# Patient Record
Sex: Female | Born: 1955 | Race: White | Hispanic: No | Marital: Married | Smoking: Former smoker
Health system: Southern US, Community
[De-identification: ages and names within clinical notes are randomized; demographics above are authoritative.]

## PROBLEM LIST (undated history)

## (undated) DIAGNOSIS — M1990 Unspecified osteoarthritis, unspecified site: Secondary | ICD-10-CM

## (undated) DIAGNOSIS — G4730 Sleep apnea, unspecified: Secondary | ICD-10-CM

## (undated) DIAGNOSIS — G709 Myoneural disorder, unspecified: Secondary | ICD-10-CM

## (undated) DIAGNOSIS — G8929 Other chronic pain: Secondary | ICD-10-CM

## (undated) DIAGNOSIS — Z8719 Personal history of other diseases of the digestive system: Secondary | ICD-10-CM

## (undated) DIAGNOSIS — J189 Pneumonia, unspecified organism: Secondary | ICD-10-CM

## (undated) DIAGNOSIS — G4700 Insomnia, unspecified: Secondary | ICD-10-CM

## (undated) DIAGNOSIS — J45909 Unspecified asthma, uncomplicated: Secondary | ICD-10-CM

## (undated) DIAGNOSIS — M810 Age-related osteoporosis without current pathological fracture: Secondary | ICD-10-CM

## (undated) DIAGNOSIS — M549 Dorsalgia, unspecified: Secondary | ICD-10-CM

## (undated) DIAGNOSIS — E039 Hypothyroidism, unspecified: Secondary | ICD-10-CM

## (undated) HISTORY — PX: TUBAL LIGATION: SHX77

## (undated) HISTORY — DX: Unspecified asthma, uncomplicated: J45.909

## (undated) HISTORY — DX: Sleep apnea, unspecified: G47.30

## (undated) HISTORY — PX: CARPAL TUNNEL RELEASE: SHX101

## (undated) HISTORY — PX: TONSILLECTOMY: SUR1361

---

## 2015-05-05 ENCOUNTER — Other Ambulatory Visit: Payer: Self-pay | Admitting: Surgery

## 2015-05-18 ENCOUNTER — Encounter: Payer: BLUE CROSS/BLUE SHIELD | Attending: Surgery | Admitting: Dietician

## 2015-05-18 DIAGNOSIS — Z6841 Body Mass Index (BMI) 40.0 and over, adult: Secondary | ICD-10-CM | POA: Insufficient documentation

## 2015-05-18 DIAGNOSIS — Z713 Dietary counseling and surveillance: Secondary | ICD-10-CM | POA: Diagnosis not present

## 2015-05-18 NOTE — Progress Notes (Signed)
  Pre-Op Assessment Visit:  Pre-Operative RYGB Surgery  Medical Nutrition Therapy:  Appt start time: 1115   End time:  1145.  Patient was seen on 05/18/15 for Pre-Operative Nutrition Assessment. Assessment and [facility] approval [facility] coordinator on 05/19/15.   Preferred Learning Style:   No preference indicated   Learning Readiness:   Ready  Handouts given during visit include:  Pre-Op Goals Bariatric Surgery Protein Shakes   During the appointment today the following Pre-Op Goals were reviewed with the patient: Maintain or lose weight as instructed by your surgeon Make healthy food choices Begin to limit portion sizes Limited concentrated sugars and fried foods Keep fat/sugar in the single digits per serving on   food labels Practice CHEWING your food  (aim for 30 chews per bite or until applesauce consistency) Practice not drinking 15 minutes before, during, and 30 minutes after each meal/snack Avoid all carbonated beverages  Avoid/limit caffeinated beverages  Avoid all sugar-sweetened beverages Consume 3 meals per day; eat every 3-5 hours Make a list of non-food related activities Aim for 64-100 ounces of FLUID daily  Aim for at least 60-80 grams of PROTEIN daily Look for a liquid protein source that contain ?15 g protein and ?5 g carbohydrate  (ex: shakes, drinks, shots)  Patient-Centered Goals: -Participate in activities with grandkids -Feeling more comfortable in chairs and rides at amusement parks -Self confidence -Reduced back and knee pain -Being able to tie shoes comfortably  Demonstrated degree of understanding via:  Teach Back  Teaching Method Utilized:  Visual Auditory Hands on  Barriers to learning/adherence to lifestyle change: none  [facility] to call the [facility] to enroll in Pre-Op and Post-Op Nutrition Education when surgery date is scheduled.

## 2015-05-19 ENCOUNTER — Encounter: Payer: Self-pay | Admitting: Dietician

## 2015-05-31 ENCOUNTER — Ambulatory Visit: Payer: Self-pay | Admitting: Dietician

## 2015-06-10 ENCOUNTER — Ambulatory Visit (HOSPITAL_COMMUNITY)
Admission: RE | Admit: 2015-06-10 | Discharge: 2015-06-10 | Disposition: A | Discharge status: Home / Self Care | Payer: BLUE CROSS/BLUE SHIELD | Source: Ambulatory Visit | Attending: Surgery | Admitting: Surgery

## 2015-06-10 ENCOUNTER — Other Ambulatory Visit: Payer: Self-pay

## 2015-06-10 DIAGNOSIS — K449 Diaphragmatic hernia without obstruction or gangrene: Secondary | ICD-10-CM | POA: Diagnosis not present

## 2015-06-10 DIAGNOSIS — K8020 Calculus of gallbladder without cholecystitis without obstruction: Secondary | ICD-10-CM | POA: Diagnosis not present

## 2015-06-10 DIAGNOSIS — K760 Fatty (change of) liver, not elsewhere classified: Secondary | ICD-10-CM | POA: Diagnosis not present

## 2015-06-14 ENCOUNTER — Encounter: Payer: Self-pay | Admitting: Dietician

## 2015-06-14 ENCOUNTER — Encounter: Payer: BLUE CROSS/BLUE SHIELD | Attending: Surgery | Admitting: Dietician

## 2015-06-14 DIAGNOSIS — Z6841 Body Mass Index (BMI) 40.0 and over, adult: Secondary | ICD-10-CM | POA: Insufficient documentation

## 2015-06-14 DIAGNOSIS — Z713 Dietary counseling and surveillance: Secondary | ICD-10-CM | POA: Diagnosis not present

## 2015-06-14 NOTE — Progress Notes (Signed)
Supervised Weight Loss:  Appt start time: 405 end time:  420  SWL visit 1:  Primary concerns today: Janet [name] returns today having lost 2 pounds in the last month despite being on prednisone for a sinus infection. She has started taking recommended vitamins. Has started having [place] bacon instead of regular bacon. Also trying to drink more water.   Weight: 217.9 lbs BMI: 41.3  Goals:  -Keep reading Weight Loss Surgery for Dummies -Start thinking about not drinking while eating   Patient-Centered Goals: -Participate in activities with grandkids -Feeling more comfortable in chairs and rides at amusement parks -Self confidence -Reduced back and knee pain -Being able to tie shoes comfortably  MEDICATIONS: see list  DIETARY INTAKE:  24-hr recall: Wakes up around 6am B ( AM): 2 boiled eggs with mayo OR 2 scrambled eggs and tomato or [place] bacon  Snk ( AM) :  L ( PM): leftovers or smoothie (almond milk, frozen fruit) or tuna salad  Snk ( PM):  D ( PM): ham sandwich with chips or stewed beef with green beans and tomatoes  Snk ( PM): fat free, sugar free Jello pudding  Beverages: coffee with agave, water   Recent physical activity: none  Estimated energy needs: 1400-1600 calories  Progress Towards Goal(s):  In progress.   Nutritional Diagnosis:  [place]-3.3 Overweight/obesity related to past poor dietary habits and physical inactivity as evidenced by patient in SWL for pending bariatric surgery following dietary guidelines for continued weight loss.  Intervention:  Nutrition counseling provided.   Monitoring/Evaluation:  Dietary intake, exercise, and body weight in 4 week(s).

## 2015-06-28 ENCOUNTER — Other Ambulatory Visit (HOSPITAL_COMMUNITY): Payer: BLUE CROSS/BLUE SHIELD

## 2015-06-28 ENCOUNTER — Ambulatory Visit (HOSPITAL_COMMUNITY): Payer: BLUE CROSS/BLUE SHIELD

## 2015-06-29 ENCOUNTER — Encounter (HOSPITAL_COMMUNITY)
Admission: RE | Disposition: A | Discharge status: Home / Self Care | Payer: Self-pay | Source: Ambulatory Visit | Attending: Surgery

## 2015-06-29 ENCOUNTER — Ambulatory Visit (HOSPITAL_COMMUNITY)
Admission: RE | Admit: 2015-06-29 | Discharge: 2015-06-29 | Disposition: A | Discharge status: Home / Self Care | Payer: BLUE CROSS/BLUE SHIELD | Source: Ambulatory Visit | Attending: Surgery | Admitting: Surgery

## 2015-06-29 HISTORY — PX: BREATH TEK H PYLORI: SHX5422

## 2015-06-29 SURGERY — BREATH TEST, FOR HELICOBACTER PYLORI

## 2015-06-30 ENCOUNTER — Encounter (HOSPITAL_COMMUNITY): Payer: Self-pay | Admitting: Surgery

## 2015-06-30 DIAGNOSIS — K8020 Calculus of gallbladder without cholecystitis without obstruction: Secondary | ICD-10-CM | POA: Diagnosis not present

## 2015-06-30 DIAGNOSIS — K449 Diaphragmatic hernia without obstruction or gangrene: Secondary | ICD-10-CM | POA: Diagnosis present

## 2015-06-30 DIAGNOSIS — K760 Fatty (change of) liver, not elsewhere classified: Secondary | ICD-10-CM | POA: Diagnosis not present

## 2015-06-30 NOTE — Progress Notes (Signed)
   06/30/15 0709  BREATH TEK ASSESSMENT  Referring MD Ezzard StandingNewman  Time of Last PO Intake 2000  Baseline Breath At: 0714  Pranactin Given At: 0715  Post-Dose Breath At: 0730  Sample 1 2.0  Sample 2 2.1  Test Negative

## 2015-07-05 ENCOUNTER — Encounter: Payer: Self-pay | Admitting: Dietician

## 2015-07-05 ENCOUNTER — Encounter: Payer: BLUE CROSS/BLUE SHIELD | Attending: Surgery | Admitting: Dietician

## 2015-07-05 DIAGNOSIS — Z6841 Body Mass Index (BMI) 40.0 and over, adult: Secondary | ICD-10-CM | POA: Insufficient documentation

## 2015-07-05 DIAGNOSIS — Z713 Dietary counseling and surveillance: Secondary | ICD-10-CM | POA: Insufficient documentation

## 2015-07-05 NOTE — Progress Notes (Signed)
Supervised Weight Loss:  Appt start time: 1200 end time:  1215  SWL visit 2:  Primary concerns today: Eber JonesCarolyn returns today having lost 3 pounds in the last month. Has been sick lately. Has been keeping track of fluid, sugar, and protein intake. Has been practicing chewing well. Has been working on not drinking during meals and has had trouble with that since she has a dry mouth. Reading Weight Loss for dummies.   Has been drinking Boost for a protein shake. Still using some agave in drinks. Taking Centrum Silver chewable.    Weight: 217.9 lbs BMI: 41.3  Goals:  -Keep reading Weight Loss Surgery for Dummies -Continue working not drinking while eating -Energy managerTry Premier Protein Shakes (less sugar and more protein) -Switch to Splenda instead of agave -Buy regular Centrum multivitamin instead of Silver   Patient-Centered Goals: -Participate in activities with grandkids -Feeling more comfortable in chairs and rides at amusement parks -Self confidence -Reduced back and knee pain -Being able to tie shoes comfortably  MEDICATIONS: see list  DIETARY INTAKE:  24-hr recall: Wakes up around 6am B ( AM): 2 boiled eggs with mayo OR 2 scrambled eggs and tomato or [place] bacon  Snk ( AM) :  L ( PM): leftovers or smoothie (almond milk, frozen fruit) or tuna salad  Snk ( PM):  D ( PM): ham sandwich with chips or stewed beef with green beans and tomatoes  Snk ( PM): fat free, sugar free Jello pudding  Beverages: coffee with agave, water   Recent physical activity: working in yard most days, walking in the pool about 1 x week   Estimated energy needs: 1400-1600 calories  Progress Towards Goal(s):  In progress.   Nutritional Diagnosis:  [place]-3.3 Overweight/obesity related to past poor dietary habits and physical inactivity as evidenced by patient in SWL for pending bariatric surgery following dietary guidelines for continued weight loss.  Intervention:  Nutrition counseling  provided.   Monitoring/Evaluation:  Dietary intake, exercise, and body weight in 4 week(s).

## 2015-07-05 NOTE — Patient Instructions (Signed)
Goals:  -Keep reading Weight Loss Surgery for Dummies -Continue working not drinking while eating -Try Premier Protein Shakes (less sugar and more protein) -Switch to Splenda instead of agave -Buy regular Centrum multivitamin instead of Silver

## 2015-08-04 ENCOUNTER — Encounter: Payer: Self-pay | Admitting: Dietician

## 2015-08-04 ENCOUNTER — Encounter: Payer: BLUE CROSS/BLUE SHIELD | Attending: Surgery | Admitting: Dietician

## 2015-08-04 VITALS — Ht 61.0 in | Wt 215.6 lb

## 2015-08-04 DIAGNOSIS — Z6841 Body Mass Index (BMI) 40.0 and over, adult: Secondary | ICD-10-CM | POA: Insufficient documentation

## 2015-08-04 DIAGNOSIS — Z713 Dietary counseling and surveillance: Secondary | ICD-10-CM | POA: Diagnosis not present

## 2015-08-04 DIAGNOSIS — E669 Obesity, unspecified: Secondary | ICD-10-CM

## 2015-08-04 NOTE — Patient Instructions (Addendum)
Goals:  -Keep reading Weight Loss Surgery for Dummies -Continue working not drinking while eating -Buy regular Centrum multivitamin or Flintstone's instead of Silver -Try having more vegetables at meals

## 2015-08-04 NOTE — Progress Notes (Signed)
Supervised Weight Loss:  Appt start time: 225 end time:  240  SWL visit 3:  Primary concerns today: Janet [name] returns today having gained 1 pound in the last month. May need to start thyroid medication. Will be retested next month. Blood sugar was up at that last appointment.   Has been keeping track of fluid, sugar, and protein intake. Has been working on getting sugar intake down. Has been doing more housework and walking more than she was. Still reading Weight Loss for dummies. Drinking about 100+ oz water per day with flavor. Chews well. Has been working on not drinking during meals and has had trouble with that since she has a dry mouth.   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   Has issues with constipation and taking a fiber chew.  Weight: 215.6 lbs BMI: 40.7  Goals:  -Keep reading Weight Loss Surgery for Dummies -Continue working not drinking while eating -Buy regular Centrum multivitamin or Flintstone's instead of Silver -Try having more vegetables at meals   Patient-Centered Goals: -Participate in activities with grandkids -Feeling more comfortable in chairs and rides at amusement parks -Self confidence -Reduced back and knee pain -Being able to tie shoes comfortably  MEDICATIONS: see list  DIETARY INTAKE:  24-hr recall: Wakes up around 6am B ( AM): 2 boiled eggs with mayo OR 2 scrambled eggs and tomato or [place] bacon  Snk ( AM) :  L ( PM): leftovers or smoothie (almond milk, frozen fruit) or tuna salad  Snk ( PM):  D ( PM): ham sandwich with chips or stewed beef with green beans and tomatoes  Snk ( PM): fat free, sugar free Jello pudding  Beverages: coffee with agave, water   Recent physical activity: working in yard most days, walking in the pool about 1 x week   Estimated energy needs: 1400-1600 calories  Progress Towards Goal(s):  In progress.   Nutritional Diagnosis:  [place]-3.3 Overweight/obesity related to past poor dietary habits and  physical inactivity as evidenced by patient in SWL for pending bariatric surgery following dietary guidelines for continued weight loss.  Intervention:  Nutrition counseling provided.   Monitoring/Evaluation:  Dietary intake, exercise, and body weight in 4 week(s).

## 2015-09-07 ENCOUNTER — Encounter: Payer: BLUE CROSS/BLUE SHIELD | Attending: Surgery | Admitting: Dietician

## 2015-09-07 ENCOUNTER — Encounter: Payer: Self-pay | Admitting: Dietician

## 2015-09-07 DIAGNOSIS — Z6841 Body Mass Index (BMI) 40.0 and over, adult: Secondary | ICD-10-CM | POA: Insufficient documentation

## 2015-09-07 DIAGNOSIS — Z713 Dietary counseling and surveillance: Secondary | ICD-10-CM | POA: Diagnosis not present

## 2015-09-07 NOTE — Patient Instructions (Addendum)
Goals:  -Keep reading Weight Loss Surgery for Dummies -Continue working not drinking while eating -Continue your walking routine -Try having more vegetables at meals

## 2015-09-07 NOTE — Progress Notes (Signed)
Supervised Weight Loss:  Appt start time: 330 end time:  345  SWL visit 4:  Primary concerns today: Susa returns today having gained 1 pound in the last month. Feels like she is retaining water. She also states that her thyroid levels were abnormal, was started on synthroid. She has been keeping a food log. She did not bring it today because her husband has had some health problems and she has been distracted. She is struggling not to sip fluids during meals. Practicing portion control and trying to walk more. Taking a complete chewable multivitamin.   Weight: 216.7 lbs BMI: 41  Goals:  -Keep reading Weight Loss Surgery for Dummies -Continue working not drinking while eating -Buy regular Centrum multivitamin or Flintstone's instead of Silver -Try having more vegetables at meals   Patient-Centered Goals: -Participate in activities with grandkids -Feeling more comfortable in chairs and rides at amusement parks -Self confidence -Reduced back and knee pain -Being able to tie shoes comfortably  MEDICATIONS: see list  DIETARY INTAKE:  24-hr recall: Wakes up around 6am B ( AM): 2 boiled eggs with mayo OR 2 scrambled eggs and tomato or [place] bacon  Snk ( AM) :  L ( PM): leftovers or smoothie (almond milk, frozen fruit) or tuna salad  Snk ( PM):  D ( PM): ham sandwich with chips or stewed beef with green beans and tomatoes  Snk ( PM): fat free, sugar free Jello pudding  Beverages: coffee with agave, water   Recent physical activity: working in yard most days, walking in the pool about 1 x week   Estimated energy needs: 1400-1600 calories  Progress Towards Goal(s):  In progress.   Nutritional Diagnosis:  [place]-3.3 Overweight/obesity related to past poor dietary habits and physical inactivity as evidenced by patient in SWL for pending bariatric surgery following dietary guidelines for continued weight loss.  Intervention:  Nutrition counseling provided.   Monitoring/Evaluation:   Dietary intake, exercise, and body weight in 4 week(s).

## 2015-10-04 ENCOUNTER — Ambulatory Visit: Payer: BLUE CROSS/BLUE SHIELD | Admitting: Dietician

## 2015-10-04 ENCOUNTER — Encounter: Payer: Self-pay | Admitting: Dietician

## 2015-10-04 ENCOUNTER — Encounter: Payer: BLUE CROSS/BLUE SHIELD | Attending: Surgery | Admitting: Dietician

## 2015-10-04 DIAGNOSIS — Z6841 Body Mass Index (BMI) 40.0 and over, adult: Secondary | ICD-10-CM | POA: Insufficient documentation

## 2015-10-04 DIAGNOSIS — Z713 Dietary counseling and surveillance: Secondary | ICD-10-CM | POA: Diagnosis not present

## 2015-10-04 NOTE — Progress Notes (Signed)
Supervised Weight Loss:  Appt start time: 305 end time:  320  SWL visit 5:  Primary concerns today: Kendahl returns today having lost 2.5 pounds in the last month. She states that her generic levothyroxine was causing severe diarrhea and was recently switched to synthroid. She is still struggling not to drink while she is eating. Very thirsty in the mornings. Almost finished with Weight Loss Surgery for Dummies book.    Weight: 214 lbs BMI: 40.5  Goals:  -Keep reading Weight Loss Surgery for Dummies -Continue working not drinking while eating -Buy regular Centrum multivitamin or Flintstone's instead of Silver -Try having more vegetables at meals   Patient-Centered Goals: -Participate in activities with grandkids -Feeling more comfortable in chairs and rides at amusement parks -Self confidence -Reduced back and knee pain -Being able to tie shoes comfortably  MEDICATIONS: see list  DIETARY INTAKE:  24-hr recall: Wakes up around 6am B ( AM): 2 boiled eggs with mayo OR 2 scrambled eggs and tomato or [place] bacon OR 1 packet instant oatmeal Snk ( AM) :  L ( PM): leftovers or smoothie (almond milk, frozen fruit) or tuna salad  Snk ( PM):  D ( PM): ham sandwich with chips or stewed beef with green beans and tomatoes  Snk ( PM): fat free, sugar free Jello pudding  Beverages: coffee with agave, water   Recent physical activity: working in yard most days, walking in the pool about 1 x week   Estimated energy needs: 1400-1600 calories  Progress Towards Goal(s):  In progress.   Nutritional Diagnosis:  -3.3 Overweight/obesity related to past poor dietary habits and physical inactivity as evidenced by patient in SWL for pending bariatric surgery following dietary guidelines for continued weight loss.  Intervention:  Nutrition counseling provided.   Monitoring/Evaluation:  Dietary intake, exercise, and body weight in 4 week(s).

## 2015-11-17 ENCOUNTER — Encounter: Payer: BLUE CROSS/BLUE SHIELD | Attending: Surgery | Admitting: Dietician

## 2015-11-17 DIAGNOSIS — Z713 Dietary counseling and surveillance: Secondary | ICD-10-CM | POA: Diagnosis not present

## 2015-11-17 DIAGNOSIS — Z6841 Body Mass Index (BMI) 40.0 and over, adult: Secondary | ICD-10-CM | POA: Insufficient documentation

## 2015-11-17 NOTE — Progress Notes (Signed)
Supervised Weight Loss:  Appt start time: 405 end time:  420  SWL visit 6:  Primary concerns today: Janet JonesCarolyn returns today having gained 2 pounds. Finished with Weight Loss Surgery for Dummies book. She plans to start water aerobics in January. She is a [name] nervous but excited and feeling postive about surgery. She is getting more used to not drinking while eating. She feels mentally prepared for the types of foods that are recommended as well as the amount.    Weight: 216 lbs BMI: 40.9  Goals:  -Keep reading Weight Loss Surgery for Dummies -Continue working not drinking while eating -Buy regular Centrum multivitamin or Flintstone's instead of Silver -Try having more vegetables at meals   Patient-Centered Goals: -Participate in activities with grandkids -Feeling more comfortable in chairs and rides at amusement parks -Self confidence -Reduced back and knee pain -Being able to tie shoes comfortably  MEDICATIONS: see list  DIETARY INTAKE:  24-hr recall: Wakes up around 6am B ( AM): 2 boiled eggs with mayo OR 2 scrambled eggs and tomato or [place] bacon OR 1 packet instant oatmeal Snk ( AM) :  L ( PM): leftovers or smoothie (almond milk, frozen fruit) or tuna salad  Snk ( PM):  D ( PM): ham sandwich with chips or stewed beef with green beans and tomatoes  Snk ( PM): fat free, sugar free Jello pudding  Beverages: coffee with agave, water   Recent physical activity: working in yard most days, walking in the pool about 1 x week   Estimated energy needs: 1400-1600 calories  Progress Towards Goal(s):  In progress.   Nutritional Diagnosis:  Janet [name]-3.3 Overweight/obesity related to past poor dietary habits and physical inactivity as evidenced by patient in SWL for pending bariatric surgery following dietary guidelines for continued weight loss.  Intervention:  Nutrition counseling provided.   Monitoring/Evaluation:  Dietary intake, exercise, and body weight in 4 week(s).

## 2015-11-18 ENCOUNTER — Encounter: Payer: Self-pay | Admitting: Dietician

## 2016-01-04 ENCOUNTER — Other Ambulatory Visit: Payer: Self-pay | Admitting: Surgery

## 2016-01-09 ENCOUNTER — Ambulatory Visit: Payer: BLUE CROSS/BLUE SHIELD

## 2016-01-10 ENCOUNTER — Encounter: Payer: Self-pay | Admitting: Dietician

## 2016-01-10 ENCOUNTER — Encounter: Payer: BLUE CROSS/BLUE SHIELD | Attending: Surgery | Admitting: Dietician

## 2016-01-10 DIAGNOSIS — Z6841 Body Mass Index (BMI) 40.0 and over, adult: Secondary | ICD-10-CM | POA: Diagnosis not present

## 2016-01-10 DIAGNOSIS — Z713 Dietary counseling and surveillance: Secondary | ICD-10-CM | POA: Diagnosis not present

## 2016-01-10 NOTE — Progress Notes (Signed)
  Pre-Operative Nutrition Class:  Appt start time: 900   End time:  1000.  Patient was seen on 01/10/2016 for Pre-Operative Bariatric Surgery Education at [facility].   Surgery date: 01/23/2016 Surgery type: RYGB [facility] weight at [facility]: 220 lbs on 05/18/2015 Weight today: 223 lbs  TANITA  BODY COMP RESULTS  01/10/16   BMI (kg/m^2) 42.1   Fat Mass (lbs) 114.5   Fat Free Mass (lbs) 108.5   Total Body Water (lbs) 79.5   Samples given per MNT protocol. Patient educated on appropriate usage: Unjury protein powder (vanilla - qty 1) Lot #: [id number] Exp: 08/2016  Premier protein shake (strawberry - qty 1) Lot #: [id number] Exp: 08/2016  Celebrate calcium citrate chew (berry - qty 1) Lot #: [id number] Exp: 02/2017  PB2 (qty 1) Lot #: [id number] Exp: 09/2017  The following the learning objectives were met by the patient during this course:  Identify Pre-Op Dietary Goals and will begin 2 weeks pre-operatively  Identify appropriate sources of fluids and proteins   State protein recommendations and appropriate sources pre and post-operatively  Identify Post-Operative Dietary Goals and will follow for 2 weeks post-operatively  Identify appropriate multivitamin and calcium sources  Describe the need for physical activity post-operatively and will follow MD recommendations  State when to call healthcare provider regarding medication questions or post-operative complications  Handouts given during class include:  Pre-Op Bariatric Surgery Diet Handout  Protein Shake Handout  Post-Op Bariatric Surgery Nutrition Handout  BELT Program Information Flyer  Support Group Information Flyer  [facility] Bariatric Supplements Price List  Follow-Up Plan: Patient will follow-up at [facility] 2 weeks post operatively for diet advancement per MD.

## 2016-01-18 ENCOUNTER — Encounter (HOSPITAL_COMMUNITY)
Admission: RE | Admit: 2016-01-18 | Discharge: 2016-01-18 | Disposition: A | Discharge status: Home / Self Care | Payer: BLUE CROSS/BLUE SHIELD | Source: Ambulatory Visit | Attending: Surgery | Admitting: Surgery

## 2016-01-18 ENCOUNTER — Encounter (HOSPITAL_COMMUNITY): Payer: Self-pay

## 2016-01-18 DIAGNOSIS — Z01812 Encounter for preprocedural laboratory examination: Secondary | ICD-10-CM | POA: Diagnosis not present

## 2016-01-18 DIAGNOSIS — K8020 Calculus of gallbladder without cholecystitis without obstruction: Secondary | ICD-10-CM | POA: Insufficient documentation

## 2016-01-18 HISTORY — DX: Age-related osteoporosis without current pathological fracture: M81.0

## 2016-01-18 HISTORY — DX: Dorsalgia, unspecified: M54.9

## 2016-01-18 HISTORY — DX: Insomnia, unspecified: G47.00

## 2016-01-18 HISTORY — DX: Unspecified osteoarthritis, unspecified site: M19.90

## 2016-01-18 HISTORY — DX: Other chronic pain: G89.29

## 2016-01-18 HISTORY — DX: Hypothyroidism, unspecified: E03.9

## 2016-01-18 LAB — COMPREHENSIVE METABOLIC PANEL
ALBUMIN: 4 g/dL (ref 3.5–5.0)
ALK PHOS: 45 U/L (ref 38–126)
ALT: 22 U/L (ref 14–54)
ANION GAP: 10 (ref 5–15)
AST: 24 U/L (ref 15–41)
BILIRUBIN TOTAL: 0.3 mg/dL (ref 0.3–1.2)
BUN: 16 mg/dL (ref 6–20)
CALCIUM: 9.9 mg/dL (ref 8.9–10.3)
CO2: 27 mmol/L (ref 22–32)
Chloride: 103 mmol/L (ref 101–111)
Creatinine, Ser: 0.76 mg/dL (ref 0.44–1.00)
GFR calc Af Amer: >60 mL/min (ref >60)
GFR calc non Af Amer: >60 mL/min (ref >60)
GLUCOSE: 88 mg/dL (ref 65–99)
POTASSIUM: 4.8 mmol/L (ref 3.5–5.1)
SODIUM: 140 mmol/L (ref 135–145)
TOTAL PROTEIN: 7.7 g/dL (ref 6.5–8.1)

## 2016-01-18 LAB — CBC WITH DIFFERENTIAL/PLATELET
BASOS ABS: 0 10*3/uL (ref 0.0–0.1)
BASOS PCT: 0 %
EOS ABS: 0.1 10*3/uL (ref 0.0–0.7)
Eosinophils Relative: 1 %
HEMATOCRIT: 39.2 % (ref 36.0–46.0)
HEMOGLOBIN: 12.6 g/dL (ref 12.0–15.0)
Lymphocytes Relative: 19 %
Lymphs Abs: 1.8 10*3/uL (ref 0.7–4.0)
MCH: 30.4 pg (ref 26.0–34.0)
MCHC: 32.1 g/dL (ref 30.0–36.0)
MCV: 94.7 fL (ref 78.0–100.0)
Monocytes Absolute: 0.6 10*3/uL (ref 0.1–1.0)
Monocytes Relative: 7 %
NEUTROS ABS: 7.1 10*3/uL (ref 1.7–7.7)
NEUTROS PCT: 73 %
Platelets: 283 10*3/uL (ref 150–400)
RBC: 4.14 MIL/uL (ref 3.87–5.11)
RDW: 15 % (ref 11.5–15.5)
WBC: 9.6 10*3/uL (ref 4.0–10.5)

## 2016-01-18 NOTE — Patient Instructions (Addendum)
YOUR PROCEDURE IS SCHEDULED ON :  01/23/16  REPORT TO [facility] MAIN ENTRANCE FOLLOW SIGNS TO EAST ELEVATOR - GO TO 3rd FLOOR CHECK IN AT 3 EAST NURSES STATION (SHORT STAY) AT:  5:05 AM  CALL THIS NUMBER IF YOU HAVE PROBLEMS THE MORNING OF SURGERY [phone]  REMEMBER:ONLY 1 PER PERSON MAY GO TO SHORT STAY WITH YOU TO GET READY THE MORNING OF YOUR SURGERY  DO NOT EAT FOOD OR DRINK LIQUIDS AFTER MIDNIGHT  TAKE THESE MEDICINES THE MORNING OF SURGERY: CYMBALTA / SYNTHROID / USE NASONEX SPRAY  YOU MAY NOT HAVE ANY METAL ON YOUR BODY INCLUDING HAIR PINS AND PIERCING'S. DO NOT WEAR JEWELRY, MAKEUP, LOTIONS, POWDERS OR PERFUMES. DO NOT WEAR NAIL POLISH. DO NOT SHAVE 48 HRS PRIOR TO SURGERY. MEN MAY SHAVE FACE AND NECK.  DO NOT BRING VALUABLES TO HOSPITAL. [facility] IS NOT RESPONSIBLE FOR VALUABLES.  CONTACTS, DENTURES OR PARTIALS MAY NOT BE WORN TO SURGERY. LEAVE SUITCASE IN CAR. CAN BE BROUGHT TO ROOM AFTER SURGERY.  PATIENTS DISCHARGED THE DAY OF SURGERY WILL NOT BE ALLOWED TO DRIVE HOME.  PLEASE READ OVER THE FOLLOWING INSTRUCTION SHEETS _________________________________________________________________________________                                          [place] - PREPARING FOR SURGERY  Before surgery, you can play an important role.  Because skin is not sterile, your skin needs to be as free of germs as possible.  You can reduce the number of germs on your skin by washing with CHG (chlorahexidine gluconate) soap before surgery.  CHG is an antiseptic cleaner which kills germs and bonds with the skin to continue killing germs even after washing. Please DO NOT use if you have an allergy to CHG or antibacterial soaps.  If your skin becomes reddened/irritated stop using the CHG and inform your nurse when you arrive at Short Stay. Do not shave (including legs and underarms) for at least 48 hours prior to the first CHG shower.  You may shave your face. Please  follow these instructions carefully:   1.  Shower with CHG Soap the night before surgery and the  morning of Surgery.   2.  If you choose to wash your hair, wash your hair first as usual with your  normal  Shampoo.   3.  After you shampoo, rinse your hair and body thoroughly to remove the  shampoo.                                         4.  Use CHG as you would any other liquid soap.  You can apply chg directly  to the skin and wash . Gently wash with scrungie or clean wascloth    5.  Apply the CHG Soap to your body ONLY FROM THE NECK DOWN.   Do not use on open                           Wound or open sores. Avoid contact with eyes, ears mouth and genitals (private parts).                        Genitals (private parts)  with your normal soap.              6.  Wash thoroughly, paying special attention to the area where your surgery  will be performed.   7.  Thoroughly rinse your body with warm water from the neck down.   8.  DO NOT shower/wash with your normal soap after using and rinsing off  the CHG Soap .                9.  Pat yourself dry with a clean towel.             10.  Wear clean night clothes to bed after shower             11.  Place clean sheets on your bed the night of your first shower and do not  sleep with pets.  Day of Surgery : Do not apply any lotions/deodorants the morning of surgery.  Please wear clean clothes to the hospital/surgery center.  FAILURE TO FOLLOW THESE INSTRUCTIONS MAY RESULT IN THE CANCELLATION OF YOUR SURGERY    PATIENT SIGNATURE_________________________________  ______________________________________________________________________

## 2016-01-22 NOTE — H&P (Signed)
Janet [name]  Location: [facility] Patient #: [id number] DOB: August 22, 1956 Married / Language: English / Race: White Female   History of Present Illness   The patient is a 60 year old female who presents for follow up bariatric surgery evaluation.  She goes by "Janet [name]".   Her PCP is Dr. Salvadore Dom [facility])  She comes with her husband.   She is doing well. She is ready for the surgery. She is scheduled for 23 January 2016 for her gastric bypass. I went over with her the findings on the gallbladder ultrasound and discussed with her about proceeding with cholecystectomy at the same time as a gastric bypass.   I gave her information about her bowel prep, I gave her prescription for postop pain medicine.  She is ready for surgery.  She had an UGI on 06/10/2015 - small HH She had an [place] on 06/10/2015 - gall stones Psych - Dr. Cyndia Skeeters - 05/10/2015  History of obesity (05/2015): She went to the information session online. Her husband's cousin had a sleeve gastrectomy. Her husband works with somebody had a gastric bypass. The patient is interested in a gastric bypass.  She has tried multiple diets including Weight Watchers, Nutrisystem, Slim fast, [employer] at home. She had the most success with the [facility] in [place], but that has closed down (the owner died). She had the most success with the [facility] Center - she lost 90 pounds, but gained it all back.  Per the 1991 NIH Consensus Statement, the patient is a candidate for bariatric surgery. The patient attended an information session and reviewed the types of bariatric surgery.  The patient is interested in the Roux en Y Gastric Bypass. I discussed with the patient the indications and risks of bariatric surgery. The potential risks of surgery include, but are not limited to, bleeding, infection, leak from the bowel, DVT and PE, open surgery, long term nutrition consequences,  and death. The patient understands the importance of compliance and long term follow-up with our group after surgery.  I discussed with the patient the indications and risks of gall bladder surgery. The primary risks of gall bladder surgery include, but are not limited to, bleeding, infection, common bile duct injury, and open surgery. I tried to answer the patient's questions. I gave the patient literature about gall bladder surgery.  Past Medical History: 1. Sleep apnea - but cannot tolerate CPAP 2. Back pain, arthiritis, and issues. she has had 4 injections She sees Dr. Venetia Maxon 3. Family history of colon cancer - she gets a colonoscopy every 5 years 4. Tubal ligation  Social history: Married. Her husband is with her. 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 Her mother died of ALS. She has 1 son age 81. Note: She does not like to drive in [place].   Other Problems Kandis Cocking, MD; 01/12/2016 4:24 PM) Arthritis Asthma Back Pain Sleep Apnea  Past Surgical History Kandis Cocking, MD; 01/12/2016 4:24 PM) Breast Biopsy Right. Tonsillectomy  Allergies (Sonya Bynum, CMA; 01/12/2016 4:19 PM) Codeine and Related  Medication History (Sonya Bynum, CMA; 01/12/2016 4:19 PM) Nasonex (50MCG/ACT Suspension, Nasal) Active. DULoxetine HCl (  Capsule DR Part, Oral) Active. TraZODone HCl (  Tablet, Oral) Active. Carisoprodol (  Tablet, Oral) Active. Nucynta ER (  Tablet ER 12HR, Oral) Active. Mucinex (  Tablet ER, Oral) Active. Loratadine (  Tablet Chewable, Oral) Active. Medications Reconciled Synthroid ( Tablet, Oral) Active.  Vitals (Sonya Bynum  CMA; 01/12/2016 4:18 PM) 01/12/2016 4:18 PM Weight: 221 lb Height: 61in Body Surface Area: 1.97 m Body Mass Index: 41.76 kg/m  Temp.: 97.76F(Temporal)  Pulse: 81 (Regular)  BP: 130/72 (Sitting, Left Arm,  Standard)   Physical Exam  General: WN obese WF alert and generally healthy appearing. HEENT: Normal. Pupils equal.  Neck: Supple. No mass. No thyroid mass. Lymph Nodes: No supraclavicular or cervical nodes.  Lungs: Clear to auscultation and symmetric breath sounds. Heart: RRR. No murmur or rub.  Abdomen: Soft. No mass. No tenderness. No hernia. Normal bowel sounds. umbilical scar She is about 1/2 apple and about 1/2 pear  Extremities: Good strength and ROM in upper and lower extremities.  Neurologic: Grossly intact to motor and sensory function. Psychiatric: Has normal mood and affect. Behavior is normal.  Assessment & Plan  1.  OBESITY, MORBID, BMI 40.0-49.9  Impression: She is scheduled for RYGB on 01/23/2016.   She has her bowel prep  She has her post op pain meds.  2.  ASYMPTOMATIC CHOLELITHIASIS (K80.20)  Impression: Discussed lap chole with patient and I gave her literature on gall bladder surgery  3. Sleep apnea - but cannot tolerate CPAP 4. Back pain, arthiritis, and issues. she has had 4 injections She sees Dr. Rockie Neighbours, MD, [facility] Pager: [phone] Office phone:  [phone]

## 2016-01-23 ENCOUNTER — Inpatient Hospital Stay (HOSPITAL_COMMUNITY): Payer: BLUE CROSS/BLUE SHIELD

## 2016-01-23 ENCOUNTER — Inpatient Hospital Stay (HOSPITAL_COMMUNITY): Payer: BLUE CROSS/BLUE SHIELD | Admitting: Certified Registered"

## 2016-01-23 ENCOUNTER — Inpatient Hospital Stay (HOSPITAL_COMMUNITY)
Admission: RE | Admit: 2016-01-23 | Discharge: 2016-01-25 | DRG: 620 | Disposition: A | Discharge status: Home / Self Care | Payer: BLUE CROSS/BLUE SHIELD | Source: Ambulatory Visit | Attending: Surgery | Admitting: Surgery

## 2016-01-23 ENCOUNTER — Encounter (HOSPITAL_COMMUNITY): Payer: Self-pay

## 2016-01-23 ENCOUNTER — Encounter (HOSPITAL_COMMUNITY)
Admission: RE | Disposition: A | Discharge status: Home / Self Care | Payer: Self-pay | Source: Ambulatory Visit | Attending: Surgery

## 2016-01-23 DIAGNOSIS — K8010 Calculus of gallbladder with chronic cholecystitis without obstruction: Secondary | ICD-10-CM | POA: Diagnosis present

## 2016-01-23 DIAGNOSIS — M1990 Unspecified osteoarthritis, unspecified site: Secondary | ICD-10-CM | POA: Diagnosis present

## 2016-01-23 DIAGNOSIS — E039 Hypothyroidism, unspecified: Secondary | ICD-10-CM | POA: Diagnosis present

## 2016-01-23 DIAGNOSIS — R52 Pain, unspecified: Secondary | ICD-10-CM

## 2016-01-23 DIAGNOSIS — Z6841 Body Mass Index (BMI) 40.0 and over, adult: Secondary | ICD-10-CM

## 2016-01-23 DIAGNOSIS — M549 Dorsalgia, unspecified: Secondary | ICD-10-CM | POA: Diagnosis present

## 2016-01-23 DIAGNOSIS — Z9889 Other specified postprocedural states: Secondary | ICD-10-CM | POA: Diagnosis not present

## 2016-01-23 DIAGNOSIS — Z01812 Encounter for preprocedural laboratory examination: Secondary | ICD-10-CM

## 2016-01-23 DIAGNOSIS — Z9884 Bariatric surgery status: Secondary | ICD-10-CM

## 2016-01-23 DIAGNOSIS — G4730 Sleep apnea, unspecified: Secondary | ICD-10-CM | POA: Diagnosis present

## 2016-01-23 DIAGNOSIS — Z87891 Personal history of nicotine dependence: Secondary | ICD-10-CM

## 2016-01-23 HISTORY — PX: CHOLECYSTECTOMY: SHX55

## 2016-01-23 HISTORY — PX: GASTRIC ROUX-EN-Y: SHX5262

## 2016-01-23 LAB — PREGNANCY, URINE: PREG TEST UR: NEGATIVE

## 2016-01-23 LAB — HEMOGLOBIN AND HEMATOCRIT, BLOOD
HEMATOCRIT: 39.2 % (ref 36.0–46.0)
HEMOGLOBIN: 12.6 g/dL (ref 12.0–15.0)

## 2016-01-23 SURGERY — LAPAROSCOPIC ROUX-EN-Y GASTRIC BYPASS WITH UPPER ENDOSCOPY
Anesthesia: Spinal | Site: Abdomen

## 2016-01-23 MED ORDER — ONDANSETRON HCL 4 MG/2ML IJ SOLN
INTRAMUSCULAR | Status: DC | PRN
  Administered 2016-01-23 (×2): 4 mg via INTRAVENOUS

## 2016-01-23 MED ORDER — SUCCINYLCHOLINE CHLORIDE 20 MG/ML IJ SOLN
INTRAMUSCULAR | Status: DC | PRN
  Administered 2016-01-23: 100 mg via INTRAVENOUS

## 2016-01-23 MED ORDER — ROCURONIUM BROMIDE 100 MG/10ML IV SOLN
INTRAVENOUS | Status: AC
  Filled 2016-01-23: qty 1

## 2016-01-23 MED ORDER — SUGAMMADEX SODIUM 200 MG/2ML IV SOLN
INTRAVENOUS | Status: AC
  Filled 2016-01-23: qty 2

## 2016-01-23 MED ORDER — LIDOCAINE HCL (CARDIAC) 20 MG/ML IV SOLN
INTRAVENOUS | Status: DC | PRN
  Administered 2016-01-23: 80 mg via INTRAVENOUS

## 2016-01-23 MED ORDER — ONDANSETRON HCL 4 MG/2ML IJ SOLN
4.0000 mg | INTRAMUSCULAR | Status: DC | PRN
  Administered 2016-01-23 – 2016-01-24 (×3): 4 mg via INTRAVENOUS
  Filled 2016-01-23 (×3): qty 2

## 2016-01-23 MED ORDER — LACTATED RINGERS IV SOLN
INTRAVENOUS | Status: DC
  Administered 2016-01-23: 12:00:00 via INTRAVENOUS

## 2016-01-23 MED ORDER — HEPARIN SODIUM (PORCINE) 5000 UNIT/ML IJ SOLN
5000.0000 [IU] | Freq: Three times a day (TID) | INTRAMUSCULAR | Status: DC
  Administered 2016-01-23 – 2016-01-25 (×5): 5000 [IU] via SUBCUTANEOUS
  Filled 2016-01-23 (×8): qty 1

## 2016-01-23 MED ORDER — ACETAMINOPHEN 10 MG/ML IV SOLN
INTRAVENOUS | Status: DC | PRN
  Administered 2016-01-23: 1000 mg via INTRAVENOUS

## 2016-01-23 MED ORDER — LACTATED RINGERS IV SOLN
INTRAVENOUS | Status: DC | PRN
  Administered 2016-01-23 (×3): via INTRAVENOUS

## 2016-01-23 MED ORDER — CHLORHEXIDINE GLUCONATE 0.12 % MT SOLN
15.0000 mL | Freq: Two times a day (BID) | OROMUCOSAL | Status: DC
  Administered 2016-01-24 – 2016-01-25 (×3): 15 mL via OROMUCOSAL
  Filled 2016-01-23 (×5): qty 15

## 2016-01-23 MED ORDER — LACTATED RINGERS IR SOLN
Status: DC | PRN
  Administered 2016-01-23: 3000 mL

## 2016-01-23 MED ORDER — CHLORHEXIDINE GLUCONATE 4 % EX LIQD: 60.0000 mL | Freq: Once | CUTANEOUS | Status: DC

## 2016-01-23 MED ORDER — FENTANYL CITRATE (PF) 100 MCG/2ML IJ SOLN
INTRAMUSCULAR | Status: DC | PRN
  Administered 2016-01-23 (×2): 100 ug via INTRAVENOUS
  Administered 2016-01-23 (×4): 50 ug via INTRAVENOUS
  Administered 2016-01-23 (×2): 25 ug via INTRAVENOUS
  Administered 2016-01-23: 50 ug via INTRAVENOUS

## 2016-01-23 MED ORDER — ONDANSETRON HCL 4 MG/2ML IJ SOLN
INTRAMUSCULAR | Status: AC
  Filled 2016-01-23: qty 4

## 2016-01-23 MED ORDER — FENTANYL CITRATE (PF) 100 MCG/2ML IJ SOLN
25.0000 ug | INTRAMUSCULAR | Status: DC | PRN
  Administered 2016-01-23: 50 ug via INTRAVENOUS
  Administered 2016-01-23 (×2): 25 ug via INTRAVENOUS

## 2016-01-23 MED ORDER — CETYLPYRIDINIUM CHLORIDE 0.05 % MT LIQD
7.0000 mL | Freq: Two times a day (BID) | OROMUCOSAL | Status: DC
  Administered 2016-01-23 – 2016-01-24 (×2): 7 mL via OROMUCOSAL

## 2016-01-23 MED ORDER — DEXAMETHASONE SODIUM PHOSPHATE 10 MG/ML IJ SOLN
INTRAMUSCULAR | Status: AC
  Filled 2016-01-23: qty 1

## 2016-01-23 MED ORDER — ACETAMINOPHEN 160 MG/5ML PO SOLN
650.0000 mg | ORAL | Status: DC | PRN
  Administered 2016-01-25: 650 mg via ORAL
  Filled 2016-01-23: qty 20.3

## 2016-01-23 MED ORDER — PROPOFOL 10 MG/ML IV BOLUS
INTRAVENOUS | Status: AC
  Filled 2016-01-23: qty 20

## 2016-01-23 MED ORDER — HEPARIN SODIUM (PORCINE) 5000 UNIT/ML IJ SOLN
5000.0000 [IU] | INTRAMUSCULAR | Status: AC
  Administered 2016-01-23: 5000 [IU] via SUBCUTANEOUS
  Filled 2016-01-23: qty 1

## 2016-01-23 MED ORDER — 0.9 % SODIUM CHLORIDE (POUR BTL) OPTIME
TOPICAL | Status: DC | PRN
  Administered 2016-01-23: 2000 mL

## 2016-01-23 MED ORDER — PROPOFOL 10 MG/ML IV BOLUS
INTRAVENOUS | Status: DC | PRN
  Administered 2016-01-23: 200 mg via INTRAVENOUS

## 2016-01-23 MED ORDER — BUPIVACAINE HCL 0.25 % IJ SOLN
INTRAMUSCULAR | Status: AC
  Filled 2016-01-23: qty 1

## 2016-01-23 MED ORDER — BUPIVACAINE HCL (PF) 0.25 % IJ SOLN
INTRAMUSCULAR | Status: DC | PRN
  Administered 2016-01-23: 48 mL

## 2016-01-23 MED ORDER — SUGAMMADEX SODIUM 200 MG/2ML IV SOLN
INTRAVENOUS | Status: DC | PRN
  Administered 2016-01-23: 200 mg via INTRAVENOUS

## 2016-01-23 MED ORDER — FENTANYL CITRATE (PF) 100 MCG/2ML IJ SOLN
INTRAMUSCULAR | Status: AC
  Filled 2016-01-23: qty 2

## 2016-01-23 MED ORDER — CEFOTETAN DISODIUM-DEXTROSE 2-2.08 GM-% IV SOLR
INTRAVENOUS | Status: AC
  Filled 2016-01-23: qty 50

## 2016-01-23 MED ORDER — SCOPOLAMINE 1 MG/3DAYS TD PT72
MEDICATED_PATCH | TRANSDERMAL | Status: AC
Start: 2016-01-23 — End: 2016-01-23
  Filled 2016-01-23: qty 1

## 2016-01-23 MED ORDER — DEXAMETHASONE SODIUM PHOSPHATE 10 MG/ML IJ SOLN
INTRAMUSCULAR | Status: DC | PRN
  Administered 2016-01-23: 10 mg via INTRAVENOUS

## 2016-01-23 MED ORDER — ESMOLOL HCL 100 MG/10ML IV SOLN
INTRAVENOUS | Status: AC
  Filled 2016-01-23: qty 10

## 2016-01-23 MED ORDER — FENTANYL CITRATE (PF) 250 MCG/5ML IJ SOLN
INTRAMUSCULAR | Status: AC
  Filled 2016-01-23: qty 5

## 2016-01-23 MED ORDER — ROCURONIUM BROMIDE 100 MG/10ML IV SOLN
INTRAVENOUS | Status: DC | PRN
  Administered 2016-01-23: 50 mg via INTRAVENOUS
  Administered 2016-01-23: 20 mg via INTRAVENOUS
  Administered 2016-01-23: 10 mg via INTRAVENOUS
  Administered 2016-01-23: 30 mg via INTRAVENOUS

## 2016-01-23 MED ORDER — ACETAMINOPHEN 10 MG/ML IV SOLN
INTRAVENOUS | Status: AC
  Filled 2016-01-23: qty 100

## 2016-01-23 MED ORDER — PROPOFOL 500 MG/50ML IV EMUL
INTRAVENOUS | Status: DC | PRN
  Administered 2016-01-23: 25 ug/kg/min via INTRAVENOUS

## 2016-01-23 MED ORDER — SODIUM CHLORIDE 0.9 % IJ SOLN
INTRAMUSCULAR | Status: DC | PRN
  Administered 2016-01-23: 8 mL

## 2016-01-23 MED ORDER — ACETAMINOPHEN 160 MG/5ML PO SOLN: 325.0000 mg | ORAL | Status: DC | PRN

## 2016-01-23 MED ORDER — PROMETHAZINE HCL 25 MG/ML IJ SOLN: 6.2500 mg | INTRAMUSCULAR | Status: DC | PRN

## 2016-01-23 MED ORDER — SCOPOLAMINE 1 MG/3DAYS TD PT72
MEDICATED_PATCH | TRANSDERMAL | Status: DC | PRN
  Administered 2016-01-23: 1 via TRANSDERMAL

## 2016-01-23 MED ORDER — LIDOCAINE HCL (CARDIAC) 20 MG/ML IV SOLN
INTRAVENOUS | Status: AC
  Filled 2016-01-23: qty 5

## 2016-01-23 MED ORDER — MIDAZOLAM HCL 2 MG/2ML IJ SOLN
INTRAMUSCULAR | Status: AC
  Filled 2016-01-23: qty 2

## 2016-01-23 MED ORDER — PREMIER PROTEIN SHAKE
2.0000 [oz_av] | Freq: Four times a day (QID) | ORAL | Status: DC
  Administered 2016-01-25: 2 [oz_av] via ORAL

## 2016-01-23 MED ORDER — CEFOTETAN DISODIUM-DEXTROSE 2-2.08 GM-% IV SOLR
2.0000 g | INTRAVENOUS | Status: AC
  Administered 2016-01-23: 2 g via INTRAVENOUS

## 2016-01-23 MED ORDER — OXYCODONE HCL 5 MG/5ML PO SOLN
5.0000 mg | ORAL | Status: DC | PRN
  Administered 2016-01-24 – 2016-01-25 (×2): 5 mg via ORAL
  Filled 2016-01-23 (×2): qty 5

## 2016-01-23 MED ORDER — MIDAZOLAM HCL 5 MG/5ML IJ SOLN
INTRAMUSCULAR | Status: DC | PRN
  Administered 2016-01-23: 2 mg via INTRAVENOUS

## 2016-01-23 MED ORDER — POTASSIUM CHLORIDE IN NACL 20-0.45 MEQ/L-% IV SOLN
INTRAVENOUS | Status: DC
  Administered 2016-01-23: 150 mL/h via INTRAVENOUS
  Administered 2016-01-23 – 2016-01-24 (×2): via INTRAVENOUS
  Administered 2016-01-24: 1000 mL via INTRAVENOUS
  Administered 2016-01-24: 22:00:00 via INTRAVENOUS
  Administered 2016-01-24: 1000 mL via INTRAVENOUS
  Filled 2016-01-23 (×9): qty 1000

## 2016-01-23 MED ORDER — TISSEEL VH 10 ML EX KIT
PACK | CUTANEOUS | Status: DC | PRN
  Administered 2016-01-23: 2

## 2016-01-23 MED ORDER — STERILE WATER FOR IRRIGATION IR SOLN
Status: DC | PRN
  Administered 2016-01-23: 1500 mL

## 2016-01-23 MED ORDER — ESMOLOL HCL 100 MG/10ML IV SOLN
INTRAVENOUS | Status: DC | PRN
  Administered 2016-01-23 (×3): 20 mg via INTRAVENOUS

## 2016-01-23 MED ORDER — MORPHINE SULFATE (PF) 2 MG/ML IV SOLN
2.0000 mg | INTRAVENOUS | Status: DC | PRN
  Administered 2016-01-23 (×2): 2 mg via INTRAVENOUS
  Administered 2016-01-23: 4 mg via INTRAVENOUS
  Administered 2016-01-24: 2 mg via INTRAVENOUS
  Administered 2016-01-24: 4 mg via INTRAVENOUS
  Filled 2016-01-23 (×3): qty 2
  Filled 2016-01-23 (×3): qty 1

## 2016-01-23 MED ORDER — TISSEEL VH 10 ML EX KIT
PACK | CUTANEOUS | Status: AC
  Filled 2016-01-23: qty 2

## 2016-01-23 SURGICAL SUPPLY — 77 items
APPLIER CLIP ROT 10 11.4 M/L (STAPLE)
APPLIER CLIP ROT 13.4 12 LRG (CLIP)
BLADE SURG 15 STRL LF DISP TIS (BLADE) ×1 IMPLANT
BLADE SURG 15 STRL SS (BLADE) ×2
CABLE HIGH FREQUENCY MONO STRZ (ELECTRODE) IMPLANT
CHLORAPREP W/TINT 26ML (MISCELLANEOUS) ×6 IMPLANT
CLIP APPLIE ROT 10 11.4 M/L (STAPLE) IMPLANT
CLIP APPLIE ROT 13.4 12 LRG (CLIP) IMPLANT
CLIP SUT LAPRA TY ABSORB (SUTURE) ×6 IMPLANT
COVER MAYO STAND STRL (DRAPES) ×3 IMPLANT
COVER SURGICAL LIGHT HANDLE (MISCELLANEOUS) IMPLANT
CUTTER FLEX LINEAR 45M (STAPLE) ×3 IMPLANT
DECANTER SPIKE VIAL GLASS SM (MISCELLANEOUS) ×3 IMPLANT
DEVICE SUT QUICK LOAD TK 5 (STAPLE) IMPLANT
DEVICE SUT TI-KNOT TK 5X26 (MISCELLANEOUS) IMPLANT
DEVICE SUTURE ENDOST 10MM (ENDOMECHANICALS) ×3 IMPLANT
DEVICE TI KNOT TK5 (MISCELLANEOUS)
DISSECTOR BLUNT TIP ENDO 5MM (MISCELLANEOUS) IMPLANT
DRAIN PENROSE 18X1/4 LTX STRL (WOUND CARE) ×3 IMPLANT
DRAPE C-ARM 42X120 X-RAY (DRAPES) ×3 IMPLANT
DRAPE CAMERA CLOSED 9X96 (DRAPES) ×3 IMPLANT
GAUZE SPONGE 4X4 16PLY XRAY LF (GAUZE/BANDAGES/DRESSINGS) ×6 IMPLANT
GLOVE SURG SIGNA 7.5 PF LTX (GLOVE) ×3 IMPLANT
GOWN STRL REUS W/TWL XL LVL3 (GOWN DISPOSABLE) ×12 IMPLANT
HOVERMATT SINGLE USE (MISCELLANEOUS) ×3 IMPLANT
KIT BASIN OR (CUSTOM PROCEDURE TRAY) ×3 IMPLANT
KIT GASTRIC LAVAGE 34FR ADT (SET/KITS/TRAYS/PACK) ×3 IMPLANT
LIQUID BAND (GAUZE/BANDAGES/DRESSINGS) ×3 IMPLANT
LUBRICANT JELLY K Y 4OZ (MISCELLANEOUS) ×3 IMPLANT
MARKER SKIN DUAL TIP RULER LAB (MISCELLANEOUS) ×3 IMPLANT
NEEDLE SPNL 22GX3.5 QUINCKE BK (NEEDLE) ×3 IMPLANT
PACK CARDIOVASCULAR III (CUSTOM PROCEDURE TRAY) ×3 IMPLANT
POUCH RETRIEVAL ECOSAC 10 (ENDOMECHANICALS) ×1 IMPLANT
POUCH RETRIEVAL ECOSAC 10MM (ENDOMECHANICALS) ×2
POUCH SPECIMEN RETRIEVAL 10MM (ENDOMECHANICALS) ×3 IMPLANT
QUICK LOAD TK 5 (STAPLE)
RELOAD 45 VASCULAR/THIN (ENDOMECHANICALS) ×9 IMPLANT
RELOAD ENDO STITCH 2.0 (ENDOMECHANICALS) ×22
RELOAD STAPLE TA45 3.5 REG BLU (ENDOMECHANICALS) ×6 IMPLANT
RELOAD STAPLER BLUE 60MM (STAPLE) ×3 IMPLANT
RELOAD STAPLER GOLD 60MM (STAPLE) ×1 IMPLANT
RELOAD STAPLER WHITE 60MM (STAPLE) IMPLANT
SCISSORS LAP 5X35 DISP (ENDOMECHANICALS) ×3 IMPLANT
SEALANT SURGICAL APPL DUAL CAN (MISCELLANEOUS) ×3 IMPLANT
SET IRRIG TUBING LAPAROSCOPIC (IRRIGATION / IRRIGATOR) ×3 IMPLANT
SHEARS HARMONIC ACE PLUS 36CM (ENDOMECHANICALS) ×3 IMPLANT
SLEEVE ADV FIXATION 12X100MM (TROCAR) IMPLANT
SLEEVE XCEL OPT CAN 5 100 (ENDOMECHANICALS) IMPLANT
SOLUTION ANTI FOG 6CC (MISCELLANEOUS) ×3 IMPLANT
STAPLE ECHEON FLEX 60 POW ENDO (STAPLE) ×3 IMPLANT
STAPLER RELOAD BLUE 60MM (STAPLE) ×9
STAPLER RELOAD GOLD 60MM (STAPLE) ×3
STAPLER RELOAD WHITE 60MM (STAPLE)
SUT MNCRL AB 4-0 PS2 18 (SUTURE) ×9 IMPLANT
SUT RELOAD ENDO STITCH 2 48X1 (ENDOMECHANICALS) ×6
SUT RELOAD ENDO STITCH 2.0 (ENDOMECHANICALS) ×5
SUT SURGIDAC NAB ES-9 0 48 120 (SUTURE) ×3 IMPLANT
SUT VIC AB 2-0 SH 27 (SUTURE) ×2
SUT VIC AB 2-0 SH 27X BRD (SUTURE) ×1 IMPLANT
SUTURE RELOAD END STTCH 2 48X1 (ENDOMECHANICALS) ×6 IMPLANT
SUTURE RELOAD ENDO STITCH 2.0 (ENDOMECHANICALS) ×5 IMPLANT
SYR 10ML ECCENTRIC (SYRINGE) ×3 IMPLANT
SYR 20CC LL (SYRINGE) ×6 IMPLANT
SYR 50ML LL SCALE MARK (SYRINGE) ×3 IMPLANT
TOWEL OR 17X26 10 PK STRL BLUE (TOWEL DISPOSABLE) ×3 IMPLANT
TRAY FOLEY W/METER SILVER 14FR (SET/KITS/TRAYS/PACK) ×3 IMPLANT
TROCAR ADV FIXATION 11X100MM (TROCAR) IMPLANT
TROCAR ADV FIXATION 12X100MM (TROCAR) IMPLANT
TROCAR ADV FIXATION 5X100MM (TROCAR) IMPLANT
TROCAR BLADELESS OPT 5 100 (ENDOMECHANICALS) IMPLANT
TROCAR UNIVERSAL OPT 12M 100M (ENDOMECHANICALS) IMPLANT
TROCAR XCEL 12X100 BLDLESS (ENDOMECHANICALS) ×3 IMPLANT
TROCAR XCEL NON-BLD 11X100MML (ENDOMECHANICALS) ×3 IMPLANT
TUBING CONNECTING 10 (TUBING) ×2 IMPLANT
TUBING CONNECTING 10' (TUBING) ×1
TUBING ENDO SMARTCAP (MISCELLANEOUS) ×3 IMPLANT
TUBING FILTER THERMOFLATOR (ELECTROSURGICAL) ×3 IMPLANT

## 2016-01-23 NOTE — Interval H&P Note (Signed)
History and Physical Interval Note:  01/23/2016 7:06 AM  Janet [name]  has presented today for surgery, with the diagnosis of Morbid Obesity  The various methods of treatment have been discussed with the patient and family.  Her husband is with her.  After consideration of risks, benefits and other options for treatment, the patient has consented to  Procedure(s): LAPAROSCOPIC ROUX-EN-Y GASTRIC BYPASS WITH UPPER ENDOSCOPY (N/A) LAPAROSCOPIC CHOLECYSTECTOMY WITH INTRAOPERATIVE CHOLANGIOGRAM (N/A) as a surgical intervention .  The patient's history has been reviewed, patient examined, no change in status, stable for surgery.  I have reviewed the patient's chart and labs.  Questions were answered to the patient's satisfaction.     Alejandra Hunt H

## 2016-01-23 NOTE — Op Note (Signed)
Upper GI endoscopy is performed at the completion of laparoscopic Roux-en-Y gastric bypass by Dr. Newman. The Olympus video endoscope was inserted into the upper esophagus and then passed under direct vision to the EG junction. The small gastric pouch was insufflated with air while the gastric outlet was clamped under irrigation by the operating surgeon. There was no evidence of leak. The anastomosis was visualized and was patent. Suture and staple lines were intact and without bleeding. The pouch was tubular and measured 4-5 cm in length. At the completion of the procedure the pouch was desufflated and the scope withdrawn. 

## 2016-01-23 NOTE — Transfer of Care (Signed)
Immediate Anesthesia Transfer of Care Note  Patient: Lawrence Marseilles  Procedure(s) Performed: Procedure(s): LAPAROSCOPIC ROUX-EN-Y GASTRIC BYPASS WITH UPPER ENDOSCOPY (N/A) LAPAROSCOPIC CHOLECYSTECTOMY WITH INTRAOPERATIVE CHOLANGIOGRAM (N/A)  Patient Location: PACU  Anesthesia Type:General  Level of Consciousness:  sedated, patient cooperative and responds to stimulation  Airway & Oxygen Therapy:Patient Spontanous Breathing and Patient connected to face mask oxgen  Post-op Assessment:  Report given to PACU RN and Post -op Vital signs reviewed and stable  Post vital signs:  Reviewed and stable  Last Vitals:  Filed Vitals:   01/23/16 0505 01/23/16 1145  BP: 149/73 166/86  Pulse: 90 93  Temp: 36.8 C 36.7 C  Resp: 16 19    Complications: No apparent anesthesia complications

## 2016-01-23 NOTE — Anesthesia Postprocedure Evaluation (Signed)
Anesthesia Post Note  Patient: Janet [name]  Procedure(s) Performed: Procedure(s) (LRB): LAPAROSCOPIC ROUX-EN-Y GASTRIC BYPASS WITH UPPER ENDOSCOPY (N/A) LAPAROSCOPIC CHOLECYSTECTOMY WITH INTRAOPERATIVE CHOLANGIOGRAM (N/A)  Patient location during evaluation: PACU Level of consciousness: awake Pain management: pain level controlled Vital Signs Assessment: post-procedure vital signs reviewed and stable Respiratory status: spontaneous breathing Cardiovascular status: stable Anesthetic complications: no    Last Vitals:  Filed Vitals:   01/23/16 1500 01/23/16 1600  BP: 176/84 147/67  Pulse: 83 76  Temp: 36.5 C 36.6 C  Resp: 18 18    Last Pain:  Filed Vitals:   01/23/16 1659  PainSc: 6                  EDWARDS,Sharilynn Cassity

## 2016-01-23 NOTE — Anesthesia Procedure Notes (Signed)
Procedure Name: Intubation Date/Time: 01/23/2016 8:37 AM Performed by: Kizzie Fantasia Pre-anesthesia Checklist: Patient identified, Emergency Drugs available, Suction available, Patient being monitored and Timeout performed Patient Re-evaluated:Patient Re-evaluated prior to inductionOxygen Delivery Method: Circle system utilized Preoxygenation: Pre-oxygenation with 100% oxygen Intubation Type: IV induction Ventilation: Oral airway inserted - appropriate to patient size Laryngoscope Size: Mac and 3 Grade View: Grade II Tube type: Oral Number of attempts: 1 Airway Equipment and Method: Patient positioned with wedge pillow and Stylet Placement Confirmation: ETT inserted through vocal cords under direct vision,  positive ETCO2,  CO2 detector and breath sounds checked- equal and bilateral Secured at: 22 cm Tube secured with: Tape Dental Injury: Teeth and Oropharynx as per pre-operative assessment

## 2016-01-23 NOTE — Op Note (Addendum)
PATIENT:   Janet [name] DOB:   Nov 23, 1956 MRN:   [id number]  DATE OF PROCEDURE: 01/23/2016                   FACILITY:  [facility]  OPERATIVE REPORT  PREOPERATIVE DIAGNOSIS:  Morbid obesity, cholelithiasis  POSTOPERATIVE DIAGNOSIS:  Morbid obesity (weight 221, BMI of 41.7), cholelithiasis, chronic cholecystitis.  PROCEDURE:  Laparoscopic Roux-en-Y gastric bypass, antecolic, antegastric (intraoperative upper endoscopy by Dr. Johna Sheriff), Cholecystectomy, intraoperative cholangiogram  SURGEON:  Sandria Bales. Ezzard Standing, MD  FIRST ASSISTANT:  B. Hoxworth, MD  ANESTHESIA:  General endotracheal.  Anesthesiologist: Judie Petit, MD CRNA: Florene Route, CRNA; Kizzie Fantasia, CRNA; Army Fossa, CRNA  General  ESTIMATED BLOOD LOSS:  Minimal.  LOCAL ANESTHESIA:  45 cc of 1/4% Marcaine  COMPLICATIONS:  None.  INDICATION FOR SURGERY:  Janet [name] is a 60 y.o. white  female who sees COLLINS, DANA, DO as her primary care doctor.  She has completed our preoperative bariatric program and now comes for a laparoscopic Roux-en-Y gastric bypass.  She was also found to have gall stones on her pre op evaluation, so I talked to her about doing a cholecystectomy at the same time as the gastric bypass.  The indications, potential complications of surgery were explained to the patient.  Potential complications of the surgery include, but are not limited to, bleeding, infection, DVT, open surgery, and long-term nutritional consequences.  OPERATIVE NOTE:  (Gastric Bypass portion)  The patient taken to room #[facility] at [facility] where Ms. Delfavero underwent a general endotracheal anesthetic, supervised by Anesthesiologist: Judie Petit, MD CRNA: Florene Route, CRNA; Kizzie Fantasia, CRNA; Army Fossa, CRNA.  The patient was given 2 g of cefoxitin at the beginning of the procedure.  A time-out was held and surgical checklist run.  The abdomen was prepped with ChloraPrep and sterilely draped.  I  accessed the abdominal cavity through the left upper quadrant using a 12 mm Optiview trocar.  I placed 7 additional trocars: 5 mm subxiphoid, 12 mm right subcostal, 12 mm right paramedian, 12 mm left paramedian, 5 mm lateral subcostal, and a 11 mm below to the right of the umbilicus and a right abdominal 5 mm trocar for the cholecystectomy.  The abdomen was insufflated and abdominal exploration carried out.  Right and left lobes of liver unremarkable.  The stomach that I could see was unremarkable.  The patient had a moderate amount of greater omentum which draped over the bowel.  I was able to push the omentum and transverse colon up and identified the ligament of Treitz to start the operation.  I measured 40 cm of the jejunum, starting at the ligament of Tritz, and divided the jejunum with a white load of 45 mm Ethicon Endo-GIA stapler.  I divided a short length into the mesentery.  I measured 100 cm of jejunum for the future gastric limb.  I put a Penrose drain on the future gastric limb of the jejunum.  I then did a side-to-side jejunojejunostomy.  I used a 45 mm white load of the Ethicon Endo-GIA stapler.  I closed the enterotomy with 2 running 2-0 Vicryl sutures.  I tested the JJ anastomosis with an alligator forceps and then covered this with Tisseel.  I closed the mesenteric defect with a running 2-0 silk suture with a Laparo-tye on each end.  I then divided the omentum with a Harmonic Scalpel.  I positioned the patient in reverse Trendelenburg and placed the liver retractor,  which was introduced into the peritoneal cavity through a subxiphoid 5 mm trocar puncture, under the left lobe of the liver.  I then identified the gastroesophageal junction.  I went to the left at the angle of His and made a window at the left side of the esophago-gastric junction for a target as my dissection.  I then went on the lesser curve of the stomach, measured 5 cm from the gastroesophageal junction down the  lesser curve and dissected into the lesser sac from the lesser curvature side of the stomach.  I did the first firing of a 60 mm gold load Ethicon Eschelon stapler and then did 3 firings of the 60 mm blue load Ethicon Eschelon stapler.  There was a small amount of stomach that was still attached, so I used a 45 mm Ethicon Endo-GIA.  This created a gastric pouch approximately 5 cm in length and 3 cm in width.  There was no bleeding from either the pouch or the stomach remnant site.  I placed Tisseel on the pouch side along the new greater curvature.  I over sewed the gastric remnant with a locking 2-0 Vicryl suture with a Laparo-tye on each end..   I then brought the jejunum ante-colic, ante-gastric up to the new stomach pouch and placed a posterior running 2-0 Vicryl suture.  I then made an enterotomy into the stomach using the Ewald as a back stop and an enterotomy into the jejunum.  I did a stapled side-to-side gastrojejunal anastomosis using these two enterotomies with a 45 mm blue load of the Ethicon Endo GIA stapler.  I tried to create a 2.5 cm gastrojejunal anastomosis.  I closed the enterotomy with a 2 running 2-0 Vicryl sutures.  I passed the Ewald tube through the gastrojejunal anastomosis and then did an anterior Connell suture running of 2-0 Vicryl suture for the anterior layer of the gastrojejunostomy.   The Ewald tube was then removed without difficulty.  I then closed the Diamond defect with a figure-of-eight 2-0 silk suture between the mesentery of the transverse colon and the mesentery of the distal jejunum.   Dr. Johna Sheriff then scrubbed out and did an intraoperative upper endoscopy.  He identified the esophagogastric junction about 39 cm, the gastrojejunal anastomosis about 44 cm.  I clamped off the small bowel.  He insufflated air and I flooded the abdomen with saline. There was no bubbling or evidence of air leak.  He then withdrew the scope and he will dictate that portion of the  operation.     I then re-inspected the anastomoses, sucked out the saline, placed Tisseel over the stomach pouch and gastrojejunal anastomosis.     (Cholecystectomy portion of the operation)  I then turned my attention to the gall bladder and the cholecystectomy.  There were adhesions of the left transverse colon to the gall bladder and the under surface of the right lobe of the liver.  These were filmy adhesions. The gall bladder was identified, grasped, and rotated cephalad.  Disssection was carried down to the gall bladder/cystic duct junction and the cystic duct isolated.  A clip was placed on the gall bladder side of the cystic duct.   An intra-operative cholangiogram was shot.   The intra-operative cholangiogram was shot using a cut off Taut catheter placed through a 14 gauge angiocath in the RUQ.  The Taut catheter was inserted in the cut cystic duct and secured with an endoclip.  A cholangiogram was shot with 10 cc of 1/2  strength Omnipaque.  Using fluoroscopy, the cholangiogram showed the flow of contrast into the common bile duct, up the hepatic radicals, and into the duodenum.  There was no mass or obstruction.  This was a normal intra-operative cholangiogram.   The Taut catheter was removed.  The cystic duct was tripley endoclipped and the cystic artery was identified and clipped.  The gall bladder was bluntly and sharpley dissected from the gall bladder bed.   After the gall bladder was removed from the liver, the gall bladder bed and Triangle of Calot were inspected.  There was no bleeding or bile leak.  The gall bladder was placed in a tough gall bladderbag and delivered through the right para median incision..  The abdomen was irrigated with 800 cc saline.   The liver retractor was removed.  The trocars were removed.  There was no bleeding at any trocar site.  The skin at each trocar site was closed with a 4-0 Monocryl suture.  I infiltrated a total about 45 cc of 0.25% Marcaine at the  trocar sites.    After the skin incisions were closed with sutures they were painted with LiquidBand.   The sponge and needle count were correct at the end of the case.  The patient tolerated the procedure well, was transported to the recovery room in good condition.   Ovidio Kin, MD, [facility] Surgery Pager: [phone] Office phone:  [phone]

## 2016-01-23 NOTE — Anesthesia Preprocedure Evaluation (Signed)
Anesthesia Evaluation  Patient identified by MRN, date of birth, ID band Patient awake    Airway Mallampati: II  TM Distance: >3 FB Neck ROM: Full    Dental   Pulmonary asthma , sleep apnea , former smoker,    breath sounds clear to auscultation       Cardiovascular negative cardio ROS   Rhythm:Regular Rate:Normal     Neuro/Psych    GI/Hepatic negative GI ROS, Neg liver ROS,   Endo/Other  Hypothyroidism   Renal/GU      Musculoskeletal  (+) Arthritis ,   Abdominal   Peds  Hematology   Anesthesia Other Findings   Reproductive/Obstetrics                             Anesthesia Physical Anesthesia Plan  ASA: III  Anesthesia Plan: Spinal   Post-op Pain Management:    Induction: Intravenous  Airway Management Planned:   Additional Equipment:   Intra-op Plan:   Post-operative Plan:   Informed Consent: I have reviewed the patients History and Physical, chart, labs and discussed the procedure including the risks, benefits and alternatives for the proposed anesthesia with the patient or authorized representative who has indicated his/her understanding and acceptance.   Dental advisory given  Plan Discussed with: CRNA and Anesthesiologist  Anesthesia Plan Comments:         Anesthesia Quick Evaluation

## 2016-01-24 ENCOUNTER — Inpatient Hospital Stay (HOSPITAL_COMMUNITY): Payer: BLUE CROSS/BLUE SHIELD

## 2016-01-24 DIAGNOSIS — Z9889 Other specified postprocedural states: Secondary | ICD-10-CM

## 2016-01-24 LAB — HEMOGLOBIN AND HEMATOCRIT, BLOOD
HEMATOCRIT: 38.1 % (ref 36.0–46.0)
HEMOGLOBIN: 12.5 g/dL (ref 12.0–15.0)

## 2016-01-24 LAB — CBC WITH DIFFERENTIAL/PLATELET
BASOS PCT: 0 %
Basophils Absolute: 0 10*3/uL (ref 0.0–0.1)
EOS ABS: 0 10*3/uL (ref 0.0–0.7)
Eosinophils Relative: 0 %
HCT: 38.4 % (ref 36.0–46.0)
Hemoglobin: 12.2 g/dL (ref 12.0–15.0)
Lymphocytes Relative: 10 %
Lymphs Abs: 1.2 10*3/uL (ref 0.7–4.0)
MCH: 30.4 pg (ref 26.0–34.0)
MCHC: 31.8 g/dL (ref 30.0–36.0)
MCV: 95.8 fL (ref 78.0–100.0)
MONO ABS: 1 10*3/uL (ref 0.1–1.0)
MONOS PCT: 8 %
NEUTROS PCT: 82 %
Neutro Abs: 10.3 10*3/uL — ABNORMAL HIGH (ref 1.7–7.7)
Platelets: 281 10*3/uL (ref 150–400)
RBC: 4.01 MIL/uL (ref 3.87–5.11)
RDW: 15.2 % (ref 11.5–15.5)
WBC: 12.4 10*3/uL — ABNORMAL HIGH (ref 4.0–10.5)

## 2016-01-24 MED ORDER — PROMETHAZINE HCL 25 MG/ML IJ SOLN
12.5000 mg | Freq: Four times a day (QID) | INTRAMUSCULAR | Status: DC | PRN
  Administered 2016-01-24: 12.5 mg via INTRAVENOUS
  Filled 2016-01-24: qty 1

## 2016-01-24 MED ORDER — IOHEXOL 300 MG/ML  SOLN
50.0000 mL | Freq: Once | INTRAMUSCULAR | Status: AC | PRN
  Administered 2016-01-24: 50 mL via ORAL

## 2016-01-24 NOTE — Progress Notes (Signed)
VASCULAR LAB PRELIMINARY  PRELIMINARY  PRELIMINARY  PRELIMINARY  Bilateral lower extremity venous duplex  completed.    Preliminary report:  Bilateral:  No evidence of DVT, superficial thrombosis, or Baker's Cyst.    Jerick Khachatryan, RVT 01/24/2016, 9:35 AM

## 2016-01-24 NOTE — Plan of Care (Signed)
Problem: Food- and Nutrition-Related Knowledge Deficit (NB-1.1) Goal: Nutrition education Formal process to instruct or train a patient/client in a skill or to impart knowledge to help patients/clients voluntarily manage or modify food choices and eating behavior to maintain or improve health. Outcome: Completed/Met Date Met:  01/24/16 Nutrition Education Note  Received consult for diet education per DROP protocol.   Discussed 2 week post op diet with pt. Emphasized that liquids must be non carbonated, non caffeinated, and sugar free. Fluid goals discussed. Pt to follow up with outpatient bariatric RD for further diet progression after 2 weeks. Multivitamins and minerals also reviewed. Teach back method used, pt expressed understanding, expect good compliance.   Diet: First 2 Weeks  You will see the dietitian about two (2) weeks after your surgery. The dietitian will increase the types of foods you can eat if you are handling liquids well:  If you have severe vomiting or nausea and cannot handle clear liquids lasting longer than 1 day, call your surgeon  Protein Shake  Drink at least 2 ounces of shake 5-6 times per day  Each serving of protein shakes (usually 8 - 12 ounces) should have a minimum of:  15 grams of protein  And no more than 5 grams of carbohydrate  Goal for protein each day:  Men = 80 grams per day  Women = 60 grams per day  Protein powder may be added to fluids such as non-fat milk or Lactaid milk or Soy milk (limit to 35 grams added protein powder per serving)   Hydration  Slowly increase the amount of water and other clear liquids as tolerated (See Acceptable Fluids)  Slowly increase the amount of protein shake as tolerated  Sip fluids slowly and throughout the day  May use sugar substitutes in small amounts (no more than 6 - 8 packets per day; i.e. Splenda)   Fluid Goal  The first goal is to drink at least 8 ounces of protein shake/drink per day (or as directed by the  nutritionist); some examples of protein shakes are [employer], [employer], EAS Edge HP, and Unjury. See handout from pre-op Bariatric Education Class:  Slowly increase the amount of protein shake you drink as tolerated  You may find it easier to slowly sip shakes throughout the day  It is important to get your proteins in first  Your fluid goal is to drink 64 - 100 ounces of fluid daily  It may take a few weeks to build up to this  32 oz (or more) should be clear liquids  And  32 oz (or more) should be full liquids (see below for examples)  Liquids should not contain sugar, caffeine, or carbonation   Clear Liquids:  Water or Sugar-free flavored water (i.e. Fruit H2O, Propel)  Decaffeinated coffee or tea (sugar-free)  Crystal Lite, Wyler's Lite, Minute Maid Lite  Sugar-free Jell-O  Bouillon or broth  Sugar-free Popsicle: *Less than 20 calories each; Limit 1 per day   Full Liquids:  Protein Shakes/Drinks + 2 choices per day of other full liquids  Full liquids must be:  No More Than 12 grams of Carbs per serving  No More Than 3 grams of Fat per serving  Strained low-fat cream soup  Non-Fat milk  Fat-free Lactaid Milk  Sugar-free yogurt (Dannon Lite & Fit, Greek yogurt)     Clayton Bibles, MS, RD, LDN Pager: [phone] After Hours Pager: [phone]

## 2016-01-24 NOTE — Care Management Note (Addendum)
Case Management Note  Patient Details  Name: Janet [name] MRN: [id number] Date of Birth: [birth year]  Subjective/Objective:                  1. LAPAROSCOPIC ROUX-EN-Y GASTRIC BYPASS WITH UPPER ENDOSCOPY, LAPAROSCOPIC CHOLECYSTECTOMY WITH INTRAOPERATIVE CHOLANGIOGRAM -  Action/Plan: Discharge planning Expected Discharge Date:                  Expected Discharge Plan:  Home/Self Care  In-House Referral:     Discharge planning Services  CM Consult  Post Acute Care Choice:    Choice offered to:     DME Arranged:    DME Agency:     HH Arranged:    HH Agency:     Status of Service:  In process, will continue to follow  Medicare Important Message Given:    Date Medicare IM Given:    Medicare IM give by:    Date Additional Medicare IM Given:    Additional Medicare Important Message give by:     If discussed at Long Length of Stay Meetings, dates discussed:    Additional Comments: Utilization Review  completed.  Anticipate pt to go home self care.  CM will continue to monitor for progress. Yves Dill, RN 01/24/2016, 1:13 PM

## 2016-01-24 NOTE — Progress Notes (Signed)
[facility] Office:  [phone] General Surgery Progress Note   LOS: 1 day  POD -  1 Day Post-Op  Assessment/Plan: 1.  LAPAROSCOPIC ROUX-EN-Y GASTRIC BYPASS WITH UPPER ENDOSCOPY, LAPAROSCOPIC CHOLECYSTECTOMY WITH INTRAOPERATIVE CHOLANGIOGRAM - 01/23/2016 - Janet [name] looks good  Will start water  2.  DVT prophylaxis - SQ heparin 3. Sleep apnea - but cannot tolerate CPAP 4. Back pain, arthiritis, and issues. she has had 4 injections She sees Dr. Venetia Maxon  Active Problems:   Morbid obesity (HCC)  Subjective:  Doing well.  Sore.  By herself.  Objective:   Filed Vitals:   01/24/16 0159 01/24/16 0600  BP: 158/68 173/65  Pulse: 91 98  Temp: 99.2 F (37.3 C) 98.7 F (37.1 C)  Resp: 16 16     Intake/Output from previous day:  01/23 0701 - 01/24 0700 In: 4192.5 [I.V.:4192.5] Out: 1380 [Urine:1280; Blood:100]  Intake/Output this shift:      Physical Exam:   General: Obese WF who is alert and oriented.    HEENT: Normal. Pupils equal. .   Lungs: Clear.  IS >1,500 cc   Abdomen: Soft   Wound: Clean   Lab Results:    Recent Labs  01/23/16 1830 01/24/16 0435  WBC  --  12.4*  HGB 12.6 12.2  HCT 39.2 38.4  PLT  --  281    BMET  No results for input(s): NA, K, CL, CO2, GLUCOSE, BUN, CREATININE, CALCIUM in the last 72 hours.  PT/INR  No results for input(s): LABPROT, INR in the last 72 hours.  ABG  No results for input(s): PHART, HCO3 in the last 72 hours.  Invalid input(s): PCO2, PO2   Studies/Results:  Dg Cholangiogram Operative  01/23/2016  CLINICAL DATA:  History of right upper quadrant pain. EXAM: INTRAOPERATIVE CHOLANGIOGRAM TECHNIQUE: Cholangiographic images from the C-arm fluoroscopic device were submitted for interpretation post-operatively. Please see the procedural report for the amount of contrast and the fluoroscopy time utilized. COMPARISON:  None. FINDINGS: There may be a nonobstructive filling defect in the proximal  common bile duct that could represent adherent sludge. Contrast drains into the duodenum. There is minor reflux into the intrahepatic ducts. Minor filling of the main pancreatic duct. No significant dilatation of the biliary system. IMPRESSION: Patent biliary system. Possible nonobstructive filling defect in the proximal common bile duct that could represent adherent sludge. Electronically Signed   By: Richarda Overlie M.D.   On: 01/23/2016 12:00     Anti-infectives:   Anti-infectives    Start     Dose/Rate Route Frequency Ordered Stop   01/23/16 0513  cefoTEtan in Dextrose 5% (CEFOTAN) IVPB 2 g     2 g Intravenous On call to O.R. 01/23/16 0513 01/23/16 0740      Ovidio Kin, MD, FACS Pager: [phone] [facility]: [phone] 01/24/2016

## 2016-01-25 LAB — CBC WITH DIFFERENTIAL/PLATELET
BASOS ABS: 0 10*3/uL (ref 0.0–0.1)
BASOS PCT: 0 %
Eosinophils Absolute: 0 10*3/uL (ref 0.0–0.7)
Eosinophils Relative: 0 %
HEMATOCRIT: 38.4 % (ref 36.0–46.0)
HEMOGLOBIN: 11.9 g/dL — AB (ref 12.0–15.0)
Lymphocytes Relative: 10 %
Lymphs Abs: 1.1 10*3/uL (ref 0.7–4.0)
MCH: 30.4 pg (ref 26.0–34.0)
MCHC: 31 g/dL (ref 30.0–36.0)
MCV: 98.2 fL (ref 78.0–100.0)
Monocytes Absolute: 0.8 10*3/uL (ref 0.1–1.0)
Monocytes Relative: 7 %
NEUTROS ABS: 8.9 10*3/uL — AB (ref 1.7–7.7)
NEUTROS PCT: 83 %
Platelets: 239 10*3/uL (ref 150–400)
RBC: 3.91 MIL/uL (ref 3.87–5.11)
RDW: 15.5 % (ref 11.5–15.5)
WBC: 10.8 10*3/uL — ABNORMAL HIGH (ref 4.0–10.5)

## 2016-01-25 NOTE — Progress Notes (Signed)
Patient alert and oriented, pain is controlled. Patient is tolerating fluids,  advanced to protein shake today, patient tolerated well. Reviewed Gastric Bypass discharge instructions with patient and patient is able to articulate understanding. Provided information on BELT program, Support Group and [facility] outpatient pharmacy. All questions answered, will continue to monitor.    

## 2016-01-25 NOTE — Discharge Summary (Signed)
Physician Discharge Summary  Patient ID:  Janet [name]  MRN: [id number]  DOB/AGE: [birth year] 60 y.o.  Admit date: 01/23/2016 Discharge date: 01/25/2016  Discharge Diagnoses:  1.  Morbid obesity  weight 221, BMI of 41.7 2. DVT prophylaxis - SQ heparin 3. Sleep apnea - but cannot tolerate CPAP 4. Back pain, arthiritis, and issues. she has had 4 injections She sees Janet [name]  Active Problems:   Morbid obesity (HCC)  Operation: Procedure(s): LAPAROSCOPIC ROUX-EN-Y GASTRIC BYPASS WITH UPPER ENDOSCOPY, LAPAROSCOPIC CHOLECYSTECTOMY WITH INTRAOPERATIVE CHOLANGIOGRAM on 01/23/2016 - Janet [name]  Discharged Condition: good  Hospital Course: Janet [name] is an 60 y.o. female whose primary care physician is Janet [name] and who was admitted 01/23/2016 with a chief complaint of morbid obesity.   She was brought to the operating room on 01/23/2016 and underwent  LAPAROSCOPIC ROUX-EN-Y GASTRIC BYPASS WITH UPPER ENDOSCOPY, LAPAROSCOPIC CHOLECYSTECTOMY WITH INTRAOPERATIVE CHOLANGIOGRAM.   She had a swallow the first day post op which showed normal post op anatomy.  She has tolerated water and will take protein drinks prior to discharge. She has done well and is ready for discharge. The discharge instructions were reviewed with the patient.  Consults: None  Significant Diagnostic Studies: Results for orders placed or performed during the hospital encounter of 01/23/16  Pregnancy, urine STAT morning of surgery  Result Value Ref Range   Preg Test, Ur NEGATIVE NEGATIVE  Hemoglobin and hematocrit, blood  Result Value Ref Range   Hemoglobin 12.6 12.0 - 15.0 g/dL   HCT 52.8 41.3 - 24.4 %  CBC WITH DIFFERENTIAL  Result Value Ref Range   WBC 12.4 (H) 4.0 - 10.5 K/uL   RBC 4.01 3.87 - 5.11 MIL/uL   Hemoglobin 12.2 12.0 - 15.0 g/dL   HCT 01.0 27.2 - 53.6 %   MCV 95.8 78.0 - 100.0 fL   MCH 30.4 26.0 - 34.0 pg   MCHC 31.8 30.0 - 36.0 g/dL   RDW 64.4 03.4 - 74.2 %   Platelets 281  150 - 400 K/uL   Neutrophils Relative % 82 %   Neutro Abs 10.3 (H) 1.7 - 7.7 K/uL   Lymphocytes Relative 10 %   Lymphs Abs 1.2 0.7 - 4.0 K/uL   Monocytes Relative 8 %   Monocytes Absolute 1.0 0.1 - 1.0 K/uL   Eosinophils Relative 0 %   Eosinophils Absolute 0.0 0.0 - 0.7 K/uL   Basophils Relative 0 %   Basophils Absolute 0.0 0.0 - 0.1 K/uL  Hemoglobin and hematocrit, blood  Result Value Ref Range   Hemoglobin 12.5 12.0 - 15.0 g/dL   HCT 59.5 63.8 - 75.6 %  CBC with Differential  Result Value Ref Range   WBC 10.8 (H) 4.0 - 10.5 K/uL   RBC 3.91 3.87 - 5.11 MIL/uL   Hemoglobin 11.9 (L) 12.0 - 15.0 g/dL   HCT 43.3 29.5 - 18.8 %   MCV 98.2 78.0 - 100.0 fL   MCH 30.4 26.0 - 34.0 pg   MCHC 31.0 30.0 - 36.0 g/dL   RDW 41.6 60.6 - 30.1 %   Platelets 239 150 - 400 K/uL   Neutrophils Relative % 83 %   Neutro Abs 8.9 (H) 1.7 - 7.7 K/uL   Lymphocytes Relative 10 %   Lymphs Abs 1.1 0.7 - 4.0 K/uL   Monocytes Relative 7 %   Monocytes Absolute 0.8 0.1 - 1.0 K/uL   Eosinophils Relative 0 %   Eosinophils Absolute 0.0 0.0 - 0.7 K/uL   Basophils  Relative 0 %   Basophils Absolute 0.0 0.0 - 0.1 K/uL    Dg Cholangiogram Operative  01/23/2016  CLINICAL DATA:  History of right upper quadrant pain. EXAM: INTRAOPERATIVE CHOLANGIOGRAM TECHNIQUE: Cholangiographic images from the C-arm fluoroscopic device were submitted for interpretation post-operatively. Please see the procedural report for the amount of contrast and the fluoroscopy time utilized. COMPARISON:  None. FINDINGS: There may be a nonobstructive filling defect in the proximal common bile duct that could represent adherent sludge. Contrast drains into the duodenum. There is minor reflux into the intrahepatic ducts. Minor filling of the main pancreatic duct. No significant dilatation of the biliary system. IMPRESSION: Patent biliary system. Possible nonobstructive filling defect in the proximal common bile duct that could represent adherent sludge.  Electronically Signed   By: Janet [name] M.D.   On: 01/23/2016 12:00   Dg Ugi W/water Sol Cm  01/24/2016  CLINICAL DATA:  Status post gastric bypass. EXAM: WATER SOLUBLE UPPER GI SERIES TECHNIQUE: Single-column upper GI series was performed using water soluble contrast. CONTRAST:  50mL OMNIPAQUE IOHEXOL 300 MG/ML  SOLN COMPARISON:  None. FLUOROSCOPY TIME:  Radiation Exposure Index (as provided by the fluoroscopic device): 24.4 If the device does not provide the exposure index: Fluoroscopy Time (in minutes and seconds):  48 seconds Number of Acquired Images:  4 FINDINGS: Postoperative appearance of the stomach and proximal small bowel loops compatible with gastric bypass surgery. No extravasation of contrast noted to suggest a leak. Contrast is identified within the jejunal bowel loops indicating patency of the remaining gastric lumen. There is gaseous distension of the proximal small bowel loops which likely reflects postoperative ileus. IMPRESSION: No complications status post gastric bypass. No extravasation of contrast material identified. Electronically Signed   By: Janet [name] M.D.   On: 01/24/2016 09:19    Discharge Exam:  Filed Vitals:   01/25/16 0217 01/25/16 0601  BP: 125/53 116/53  Pulse: 73 83  Temp: 98.8 F (37.1 C) 98.8 F (37.1 C)  Resp: 16 16    General: Obese WF who is alert and generally healthy appearing.  Lungs: Clear to auscultation and symmetric breath sounds. Heart:  RRR. No murmur or rub. Abdomen: Soft. No mass. No hernia. Normal bowel sounds.   Incisions look good.   Discharge Medications:     Medication List    STOP taking these medications        celecoxib 200 MG capsule  Commonly known as:  CELEBREX      TAKE these medications        acetaminophen 500 MG tablet  Commonly known as:  TYLENOL  Take 1,000 mg by mouth daily as needed for mild pain or moderate pain.     albuterol 108 (90 Base) MCG/ACT inhaler  Commonly known as:  PROVENTIL HFA;VENTOLIN  HFA  Inhale 1-2 puffs into the lungs every 6 (six) hours as needed for wheezing or shortness of breath.     Biotin 5000 MCG Caps  Take 2 capsules by mouth daily.     carisoprodol 350 MG tablet  Commonly known as:  SOMA  Take 175-350 mg by mouth daily as needed for muscle spasms.     [employer] VIT D 5000 HIGH-POTENCY 5000 units capsule  Generic drug:  Cholecalciferol  Take 1 capsule by mouth daily.     Cyanocobalamin 5000 MCG Subl  Place 1 tablet under the tongue daily.     desloratadine 5 MG tablet  Commonly known as:  CLARINEX  Take 5 mg by  mouth daily.     DULoxetine 60 MG capsule  Commonly known as:  CYMBALTA  Take 60 mg by mouth daily.     levothyroxine 50 MCG tablet  Commonly known as:  SYNTHROID, LEVOTHROID  Take 50 mcg by mouth daily before breakfast.     mometasone 50 MCG/ACT nasal spray  Commonly known as:  NASONEX  Place 2 sprays into the nose daily.     MUCINEX D [phone] MG Tb12  Generic drug:  Pseudoephedrine-Guaifenesin  Take 1 tablet by mouth 2 (two) times daily as needed (sinus congestion).     pediatric multivitamin chewable tablet  Chew 2 tablets by mouth daily.     tapentadol 50 MG Tabs tablet  Commonly known as:  NUCYNTA  Take 50 mg by mouth daily as needed for moderate pain.     traZODone 100 MG tablet  Commonly known as:  DESYREL  Take 100 mg by mouth at bedtime.        Disposition: 01-Home or Self Care      Discharge Instructions    Ambulate hourly while awake    Complete by:  As directed      Call MD for:  difficulty breathing, headache or visual disturbances    Complete by:  As directed      Call MD for:  persistant dizziness or light-headedness    Complete by:  As directed      Call MD for:  persistant nausea and vomiting    Complete by:  As directed      Call MD for:  redness, tenderness, or signs of infection (pain, swelling, redness, odor or green/yellow discharge around incision site)    Complete by:  As directed      Call MD  for:  severe uncontrolled pain    Complete by:  As directed      Call MD for:  temperature >101 F    Complete by:  As directed      Diet bariatric full liquid    Complete by:  As directed      Incentive spirometry    Complete by:  As directed   Perform hourly while awake           Activity:  Driving - May drive in 3 or 4 days, if doing well   Lifting - No lifting more than 15 pounds for one week, then no limit  Wound Care:   May shower  Diet:  Post gastric bypass diet.  Follow up appointment:  Call Dr. Allene Pyo office [facility]) at [phone] for an appointment in 2 - 3 weeks.  Medications and dosages:  Resume your home medications.   Signed: Ovidio Kin, M.D., [facility] Office:  [phone]  01/25/2016, 7:01 AM

## 2016-01-25 NOTE — Progress Notes (Signed)
Discharge instructions given to patient and questions answered. MD gave pt. Prescription this AM.Amera Banos Fleeta Emmer, RN 01/25/16 10:41 AM.

## 2016-01-25 NOTE — Discharge Instructions (Signed)
[facility] - DISCHARGE INSTRUCTIONS TO PATIENT  Activity:  Driving - May drive in 3 or 4 days, if doing well   Lifting - No lifting more than 15 pounds for one week, then no limit  Wound Care:   May shower  Diet:  Post gastric bypass diet.  Follow up appointment:  Call Dr. Allene Pyo office [facility]) at [phone] for an appointment in 2 - 3 weeks.  Medications and dosages:  Resume your home medications.  Call Dr. Ezzard Standing or his office  [phone]) if you have:  Temperature greater than 100.4,  Persistent nausea and vomiting,  Severe uncontrolled pain,  Redness, tenderness, or signs of infection (pain, swelling, redness, odor or green/yellow discharge around the site),  Difficulty breathing, headache or visual disturbances,  Any other questions or concerns you may have after discharge.  In an emergency, call 911 or go to an Emergency Department at a nearby hospital.       GASTRIC BYPASS/SLEEVE  Home Care Instructions   These instructions are to help you care for yourself when you go home.  Call: If you have any problems.  Call [phone] and ask for the surgeon on call  If you need immediate assistance come to the ER at [facility]. Tell the ER staff you are a new post-op gastric bypass or gastric sleeve patient  Signs and symptoms to report:  Severe  vomiting or nausea o If you cannot handle clear liquids for longer than 1 day, call your surgeon  Abdominal pain which does not get better after taking your pain medication  Fever greater than 100.4  F and chills  Heart rate over 100 beats a minute  Trouble breathing  Chest pain  Redness,  swelling, drainage, or foul odor at incision (surgical) sites  If your incisions open or pull apart  Swelling or pain in calf (lower leg)  Diarrhea (Loose bowel movements that happen often), frequent watery, uncontrolled bowel movements  Constipation, (no bowel movements for 3 days) if this  happens: o Take Milk of Magnesia, 2 tablespoons by mouth, 3 times a day for 2 days if needed o Stop taking Milk of Magnesia once you have had a bowel movement o Call your doctor if constipation continues Or o Take Miralax  (instead of Milk of Magnesia) following the label instructions o Stop taking Miralax once you have had a bowel movement o Call your doctor if constipation continues  Anything you think is abnormal for you   Normal side effects after surgery:  Unable to sleep at night or unable to concentrate  Irritability  Being tearful (crying) or depressed  These are common complaints, possibly related to your anesthesia, stress of surgery, and change in lifestyle, that usually go away a few weeks after surgery. If these feelings continue, call your medical doctor.  Wound Care: You may have surgical glue, steri-strips, or staples over your incisions after surgery  Surgical glue: Looks like clear film over your incisions and will wear off a Aloi at a time  Steri-strips: Adhesive strips of tape over your incisions. You may notice a yellowish color on skin under the steri-strips. This is used to make the steri-strips stick better. Do not pull the steri-strips off - let them fall off  Staples: Staples may be removed before you leave the hospital o If you go home with staples, call [facility] for an appointment with your surgeons nurse to have staples removed 10 days after surgery, [phone]  Showering: You may shower two (2) days after your surgery unless your surgeon tells you differently o Wash gently around incisions with warm soapy water, rinse well, and gently pat dry o If you have a drain (tube from your incision), you may need someone to hold this while you shower o No tub baths until staples are removed and incisions are healed   Medications:  Medications should be liquid or crushed if larger than the size of a dime  Extended release pills  (medication that releases a Bernick bit at a time through the  day) should not be crushed  Depending on the size and number of medications you take, you may need to space (take a few throughout the day)/change the time you take your medications so that you do not over-fill your pouch (smaller stomach)  Make sure you follow-up with you primary care physician to make medication changes needed during rapid weight loss and life -style changes  If you have diabetes, follow up with your doctor that orders your diabetes medication(s) within one week after surgery and check your blood sugar regularly   Do not drive while taking narcotics (pain medications)   Do not take acetaminophen (Tylenol) and Roxicet or Lortab Elixir at the same time since these pain medications contain acetaminophen   Diet:  First 2 Weeks You will see the nutritionist about two (2) weeks after your surgery. The nutritionist will increase the types of foods you can eat if you are handling liquids well:  If you have severe vomiting or nausea and cannot handle clear liquids lasting longer than 1 day call your surgeon Protein Shake  Drink at least 2 ounces of shake 5-6 times per day  Each serving of protein shakes (usually 8-12 ounces) should have a minimum of: o 15 grams of protein o And no more than 5 grams of carbohydrate  Goal for protein each day: o Men = 80 grams per day o Women = 60 grams per day     Protein powder may be added to fluids such as non-fat milk or Lactaid milk or Soy milk (limit to 35 grams added protein powder per serving)  Hydration  Slowly increase the amount of water and other clear liquids as tolerated (See Acceptable Fluids)  Slowly increase the amount of protein shake as tolerated  Sip fluids slowly and throughout the day  May use sugar substitutes in small amounts (no more than 6-8 packets per day; i.e. Splenda)  Fluid Goal  The first goal is to drink at least 8 ounces of protein  shake/drink per day (or as directed by the nutritionist); some examples of protein shakes are [employer], [employer], EAS Edge HP, and Unjury. - See handout from pre-op Bariatric Education Class: o Slowly increase the amount of protein shake you drink as tolerated o You may find it easier to slowly sip shakes throughout the day o It is important to get your proteins in first  Your fluid goal is to drink 64-100 ounces of fluid daily o It may take a few weeks to build up to this   32 oz. (or more) should be clear liquids And  32 oz. (or more) should be full liquids (see below for examples)  Liquids should not contain sugar, caffeine, or carbonation  Clear Liquids:  Water of Sugar-free flavored water (i.e. Fruit HO, Propel)  Decaffeinated coffee or tea (sugar-free)  Crystal lite, Wylers Lite, Minute Maid Lite  Sugar-free Jell-O  Bouillon or broth  Sugar-free Popsicle:    - Less than 20 calories each; Limit 1 per day  Full Liquids:                   Protein Shakes/Drinks + 2 choices per day of other full liquids  Full liquids must be: o No More Than 12 grams of Carbs per serving o No More Than 3 grams of Fat per serving  Strained low-fat cream soup  Non-Fat milk  Fat-free Lactaid Milk  Sugar-free yogurt (Dannon Lite & Fit, Greek yogurt)    Vitamins and Minerals  Start 1 day after surgery unless otherwise directed by your surgeon  2 Chewable Multivitamin / Multimineral Supplement with iron (i.e. Centrum for Adults)  Vitamin B-12, 350-500 micrograms sub-lingual (place tablet under the tongue) each day  Chewable Calcium Citrate with Vitamin D-3 (Example: 3 Chewable Calcium  Plus 600 with Vitamin D-3) o Take 500 mg three (3) times a day for a total of 1500 mg each day o Do not take all 3 doses of calcium at one time as it may cause constipation, and you can only absorb 500 mg at a time o Do not mix multivitamins containing iron with calcium supplements;   take 2 hours apart o Do not substitute Tums (calcium carbonate) for your calcium  Menstruating women and those at risk for anemia ( a blood disease that causes weakness) may need extra iron o Talk to your doctor to see if you need more iron  If you need extra iron: Total daily Iron recommendation (including Vitamins) is 50 to 100 mg Iron/day  Do not stop taking or change any vitamins or minerals until you talk to your nutritionist or surgeon  Your nutritionist and/or surgeon must approve all vitamin and mineral supplements   Activity and Exercise: It is important to continue walking at home. Limit your physical activity as instructed by your doctor. During this time, use these guidelines:  Do not lift anything greater than ten  (10) pounds for at least two (2) weeks  Do not go back to work or drive until Designer, industrial/product says you can  You may have sex when you feel comfortable o It is VERY important for female patients to use a reliable birth control method; fertility often increase after surgery o Do not get pregnant for at least 18 months  Start exercising as soon as your doctor tells you that you can o Make sure your doctor approves any physical activity  Start with a simple walking program  Walk 5-15 minutes each day, 7 days per week  Slowly increase until you are walking 30-45 minutes per day  Consider joining our BELT program. [phone] or email belt@[employer] .edu   Special Instructions Things to remember:  Free counseling is available for you and your family through collaboration between [facility] and [place]. Please call [phone] and leave a message  Use your CPAP when sleeping if this applies to you  Consider buying a medical alert bracelet that says you had lap-band surgery     You will likely have your first fill (fluid added to your band) 6 - 8 weeks after surgery  [facility] has a free Bariatric Surgery Support Group that meets monthly, the 3rd  Thursday, 6pm. Calvert Cantor. You can see classes online at [email or web address]  It is very important to keep all follow up appointments with your surgeon, nutritionist, primary care physician, and behavioral health practitioner o After the first year,  please follow up with your bariatric surgeon and nutritionist at least once a year in order to maintain best weight loss results                    [facility]:  [place]: [phone]               Bariatric Nurse Coordinator: [phone]  Gastric Bypass/Sleeve Home Care Instructions  Rev. 01/2013                                                         Reviewed and Endorsed                                                    by [facility] Patient Education Committee, Jan, 2014

## 2016-02-07 ENCOUNTER — Encounter: Payer: BLUE CROSS/BLUE SHIELD | Attending: Surgery

## 2016-02-07 DIAGNOSIS — Z713 Dietary counseling and surveillance: Secondary | ICD-10-CM | POA: Diagnosis not present

## 2016-02-07 DIAGNOSIS — Z6841 Body Mass Index (BMI) 40.0 and over, adult: Secondary | ICD-10-CM | POA: Diagnosis not present

## 2016-02-08 NOTE — Progress Notes (Signed)
Bariatric Class:  Appt start time: 1530 end time:  1630.  2 Week Post-Operative Nutrition Class  Patient was seen on 02/07/16 for [facility]ative [facility].   Surgery date: 01/23/2016 Surgery type: RYGB Start weight at [facility]: 220 lbs on 05/18/2015 Weight today: 203 lbs Weight change: 20 lbs  TANITA  BODY COMP RESULTS  01/10/16 02/07/16   BMI (kg/m^2) 42.1 38.4   Fat Mass (lbs) 114.5 99.5   Fat Free Mass (lbs) 108.5 103.5   Total Body Water (lbs) 79.5 76    The following the learning objectives were met by the patient during this course:  Identifies Phase 3A (Soft, High Proteins) Dietary Goals and will begin from 2 weeks post-operatively to 2 months post-operatively  Identifies appropriate sources of fluids and proteins   States protein recommendations and appropriate sources post-operatively  Identifies the need for appropriate texture modifications, mastication, and bite sizes when consuming solids  Identifies appropriate multivitamin and calcium sources post-operatively  Describes the need for physical activity post-operatively and will follow MD recommendations  States when to call healthcare provider regarding medication questions or post-operative complications  Handouts given during class include:  Phase 3A: Soft, High Protein Diet Handout  Follow-Up Plan: Patient will follow-up at [facility] in 4 weeks for 6 week post-op nutrition visit for diet advancement per MD.     

## 2016-02-27 DIAGNOSIS — E559 Vitamin D deficiency, unspecified: Secondary | ICD-10-CM | POA: Insufficient documentation

## 2016-02-27 DIAGNOSIS — J309 Allergic rhinitis, unspecified: Secondary | ICD-10-CM | POA: Insufficient documentation

## 2016-02-27 DIAGNOSIS — G4700 Insomnia, unspecified: Secondary | ICD-10-CM | POA: Insufficient documentation

## 2016-02-27 DIAGNOSIS — E039 Hypothyroidism, unspecified: Secondary | ICD-10-CM | POA: Insufficient documentation

## 2016-02-27 DIAGNOSIS — M1990 Unspecified osteoarthritis, unspecified site: Secondary | ICD-10-CM | POA: Insufficient documentation

## 2016-02-27 DIAGNOSIS — G4730 Sleep apnea, unspecified: Secondary | ICD-10-CM | POA: Insufficient documentation

## 2016-02-27 DIAGNOSIS — M4307 Spondylolysis, lumbosacral region: Secondary | ICD-10-CM | POA: Insufficient documentation

## 2016-02-27 DIAGNOSIS — R7301 Impaired fasting glucose: Secondary | ICD-10-CM | POA: Insufficient documentation

## 2016-03-27 ENCOUNTER — Encounter: Payer: Self-pay | Admitting: Dietician

## 2016-03-27 ENCOUNTER — Encounter: Payer: BLUE CROSS/BLUE SHIELD | Attending: Surgery | Admitting: Dietician

## 2016-03-27 DIAGNOSIS — Z713 Dietary counseling and surveillance: Secondary | ICD-10-CM | POA: Insufficient documentation

## 2016-03-27 DIAGNOSIS — Z6841 Body Mass Index (BMI) 40.0 and over, adult: Secondary | ICD-10-CM | POA: Diagnosis not present

## 2016-03-27 NOTE — Progress Notes (Signed)
  Follow-up visit:  8 Weeks Post-Operative RYGB Surgery  Medical Nutrition Therapy:  Appt start time: 330 end time:  405  Primary concerns today: Post-operative Bariatric Surgery Nutrition Management.  Janet JonesCarolyn returns today having lost another 18.5 lbs. She uses notebook to log foods and progress. Has pain if she eats too fast/much. 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 Having protein shakes mixed with milk and cocoa powder, 1-2x a week. Pleased with weight loss, reports she is averaging about 4.4 lbs loss a week. [facility].   Surgery date: 01/23/2016 Surgery type: [facility]YGB [facility] lbs on 05/18/2015 Weight today: 184.5 lbs Weight change: 18.5 lbs Total weight lost: 35.5   TANITA  BODY COMP RESULTS  01/10/16 02/07/16 03/27/16   BMI (kg/m^2) 42.1 38.4 34.9   Fat Mass (lbs) 114.5 99.5 77.5   Fat Free Mass (lbs) 108.5 103.5 107   Total Body Water (lbs) 79.5 76 78.5    Preferred Learning Style:  No preference indicated   Learning Readiness:   Ready  24-hr recall: B (9AM): egg (6g) Snk (AM): 1/2 yogurt (6g)  L (PM): 2 oz tilapia or chicken or part of [employer] chili (10-14g) Snk (2PM): 1/2 yogurt (6g)  D (PM): 2 oz fish or roast beef (14g) Snk (PM): part of yogurt or 1/3 cup cottage cheese (6g)  Fluid intake: 48 oz Crystal Light and water Estimated total protein intake: 48-52 g average most days  Medications: none Supplementation: taking  Using straws: no Drinking while eating: no Hair loss: no, taking Biotin Carbonated beverages: none N/V/D/C: constipation Dumping syndrome: none  Recent physical activity:  Walking a lot overall  Progress Towards Goal(s):  In progress.  Handouts given during visit include:  Phase 3B lean protein + non starchy vegetables   Nutritional Diagnosis:  [place]-3.3 Overweight/obesity related to past poor dietary habits and physical inactivity as evidenced  by patient w/ recent RYGB surgery following dietary guidelines for continued weight loss.     Intervention:  Nutrition counseling provided.  Teaching Method Utilized:  Visual Auditory Hands on  Barriers to learning/adherence to lifestyle change: none  Demonstrated degree of understanding via:  Teach Back   Monitoring/Evaluation:  Dietary intake, exercise, and body weight. Patient to call [facility] for follow up appointment.

## 2016-03-27 NOTE — Patient Instructions (Addendum)
Goals:  Follow Phase 3B: High Protein + Non-Starchy Vegetables  Eat 3-6 small meals/snacks, every 3-5 hrs  Increase lean protein foods to meet 60g goal  Try adding protein powder to yogurt  Increase fluid intake to 64oz +   Treat yourself to a new water bottle  Avoid drinking 15 minutes before, during and 30 minutes after eating  Aim for >30 min of physical activity daily  Surgery date: 01/23/2016 Surgery type: [facility]YGB [facility] lbs on 05[facility]18/2015 Weight today: 184.5 lbs Weight change: 18.5 lbs Total weight lost: 35.5   TANITA  BODY COMP RESULTS  01/10/16 02/07/16 03/27/16   BMI (kg/m^2) 42.1 38.4 34.9   Fat Mass (lbs) 114.5 99.5 77.5   Fat Free Mass (lbs) 108.5 103.5 107   Total Body Water (lbs) 79.5 76 78.5

## 2016-10-09 DIAGNOSIS — Z23 Encounter for immunization: Secondary | ICD-10-CM | POA: Diagnosis not present

## 2016-12-06 DIAGNOSIS — M5417 Radiculopathy, lumbosacral region: Secondary | ICD-10-CM | POA: Diagnosis not present

## 2016-12-06 DIAGNOSIS — M546 Pain in thoracic spine: Secondary | ICD-10-CM | POA: Diagnosis not present

## 2016-12-06 DIAGNOSIS — M4727 Other spondylosis with radiculopathy, lumbosacral region: Secondary | ICD-10-CM | POA: Diagnosis not present

## 2016-12-21 DIAGNOSIS — K5900 Constipation, unspecified: Secondary | ICD-10-CM | POA: Diagnosis not present

## 2016-12-21 DIAGNOSIS — R109 Unspecified abdominal pain: Secondary | ICD-10-CM | POA: Diagnosis not present

## 2016-12-21 DIAGNOSIS — R1013 Epigastric pain: Secondary | ICD-10-CM | POA: Diagnosis not present

## 2016-12-21 DIAGNOSIS — R1032 Left lower quadrant pain: Secondary | ICD-10-CM | POA: Diagnosis not present

## 2016-12-21 DIAGNOSIS — R1010 Upper abdominal pain, unspecified: Secondary | ICD-10-CM | POA: Diagnosis not present

## 2017-01-02 DIAGNOSIS — R109 Unspecified abdominal pain: Secondary | ICD-10-CM | POA: Diagnosis not present

## 2017-01-02 DIAGNOSIS — K5900 Constipation, unspecified: Secondary | ICD-10-CM | POA: Diagnosis not present

## 2017-01-02 DIAGNOSIS — Z9884 Bariatric surgery status: Secondary | ICD-10-CM | POA: Diagnosis not present

## 2017-01-03 IMAGING — RF DG UGI W/ KUB
14 of 24 series · 14 of 24 positions shown · non-contrast
Comparison: None.

CLINICAL DATA: Preop bariatric surgery.  Morbid obesity.

EXAM:
UPPER GI SERIES WITH KUB
TECHNIQUE: After obtaining a scout radiograph a routine upper GI series was
performed using thin barium
FLUOROSCOPY TIME:  Radiation Exposure Index (as provided by the
fluoroscopic device):
If the device does not provide the exposure index:
Fluoroscopy Time (in minutes and seconds):  1 minutes 48 seconds
Number of Acquired Images:  2

[Series 1: run · 1 of 1 slices shown (1 of 14)]
[im 1/1]
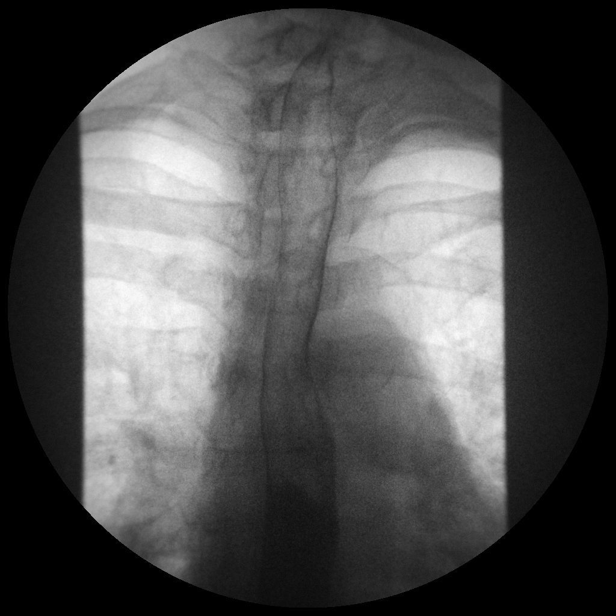

[Series 3: run · 1 of 1 slices shown (2 of 14)]
[im 1/1]
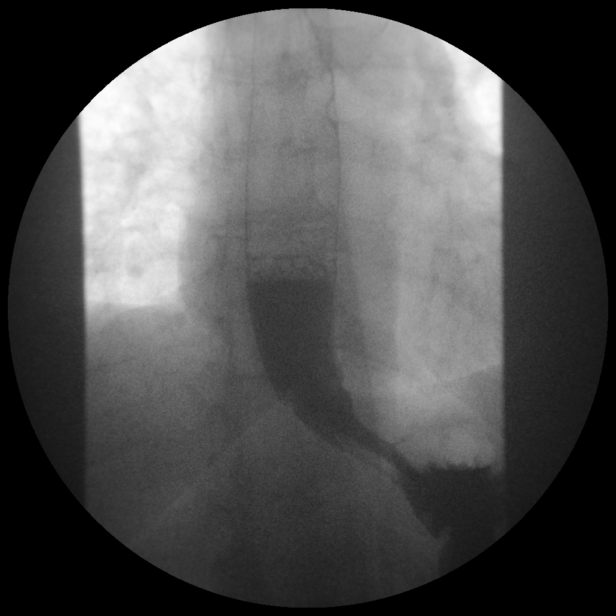

[Series 5: run · 1 of 3 slices shown (3 of 14)]
[im 1/3]
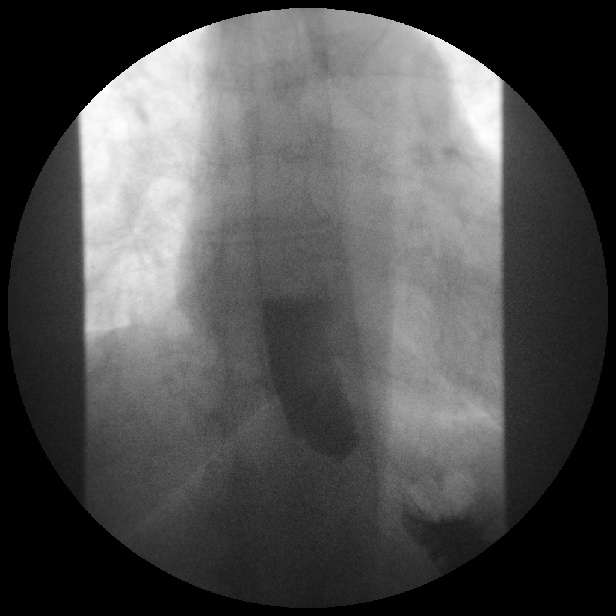

[Series 7: run · 1 of 1 slices shown (4 of 14)]
[im 1/1]
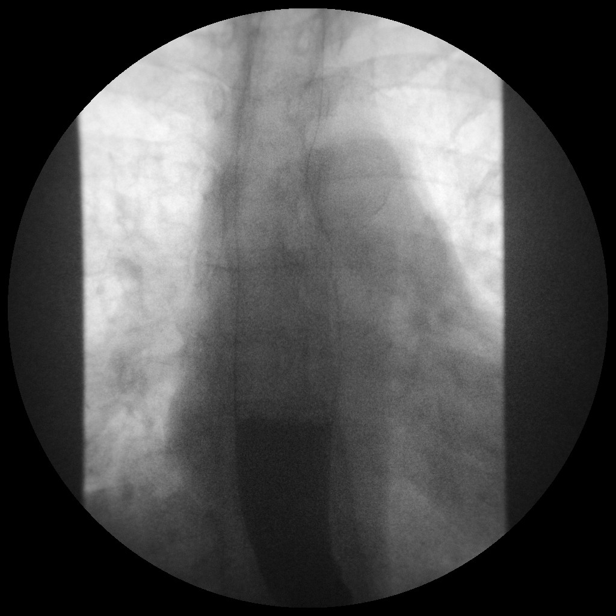

[Series 8: run · 1 of 1 slices shown (5 of 14)]
[im 1/1]
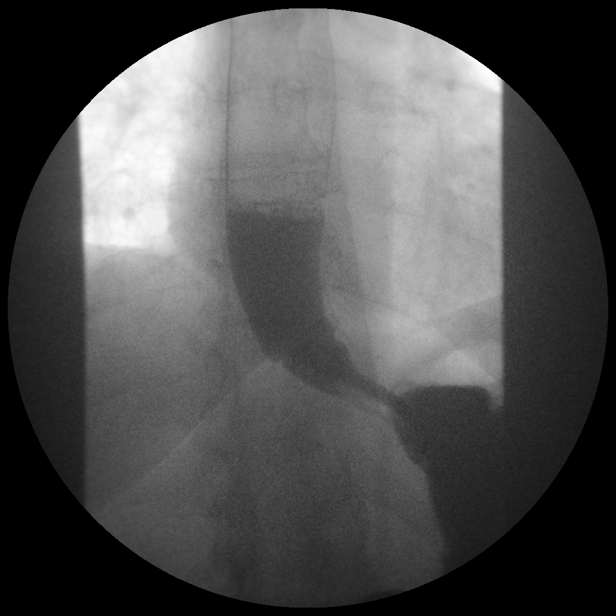

[Series 10: run · 1 of 1 slices shown (6 of 14)]
[im 1/1]
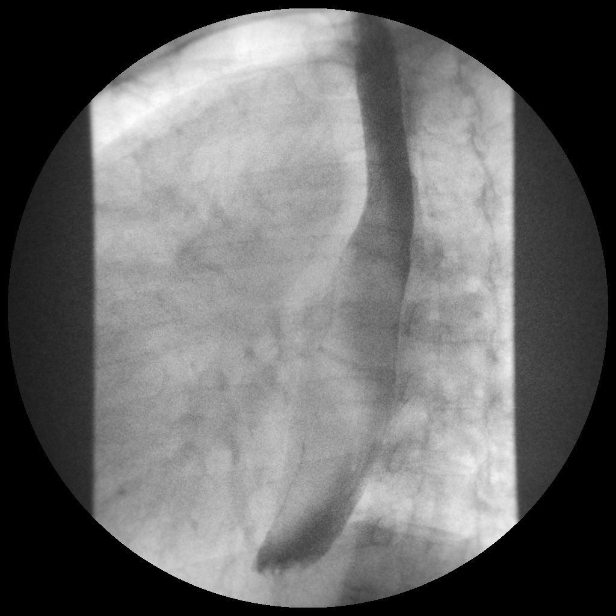

[Series 12: run · 1 of 1 slices shown (7 of 14)]
[im 1/1]
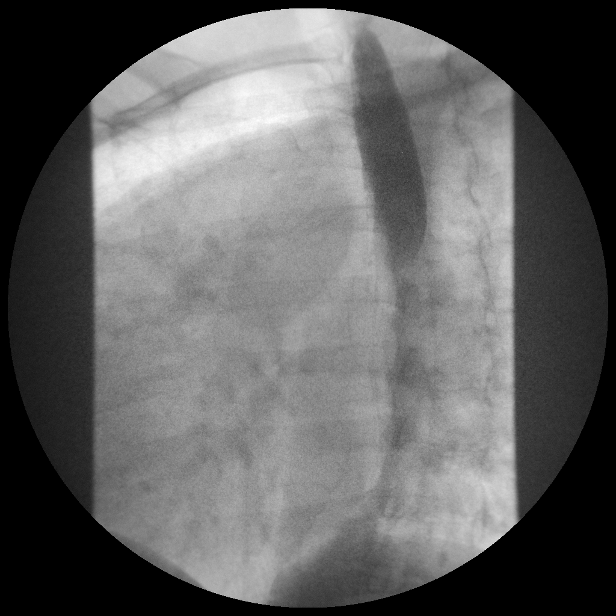

[Series 13: run · 1 of 1 slices shown (8 of 14)]
[im 1/1]
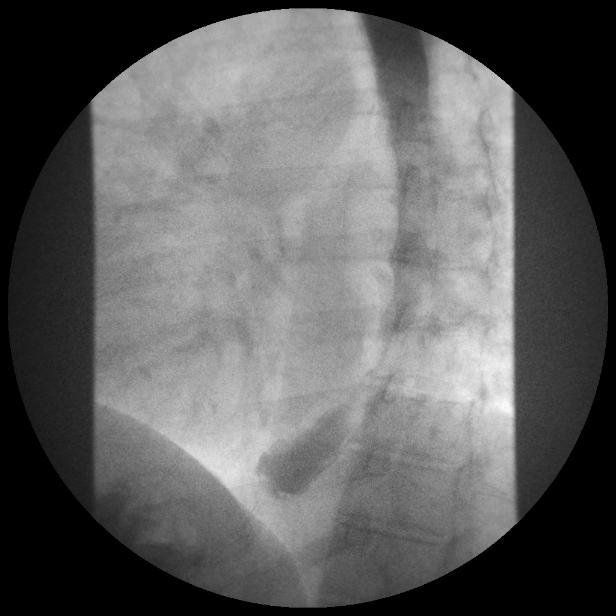

[Series 15: run · 1 of 1 slices shown (9 of 14)]
[im 1/1]
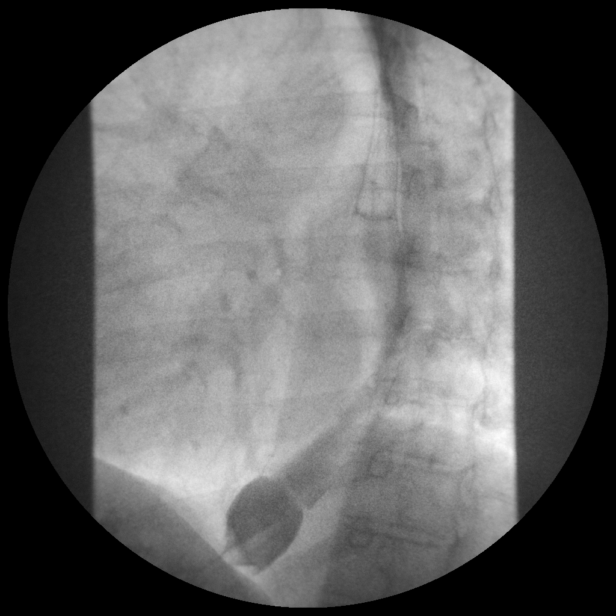

[Series 17: run · 1 of 1 slices shown (10 of 14)]
[im 1/1]
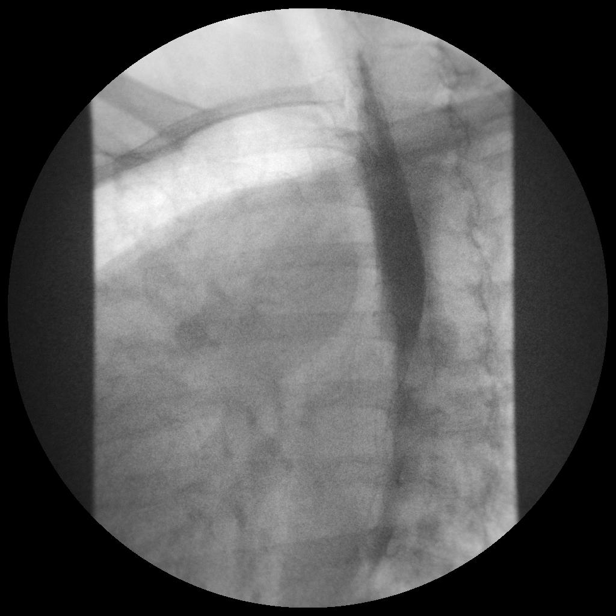

[Series 19: run · 1 of 1 slices shown (11 of 14)]
[im 1/1]
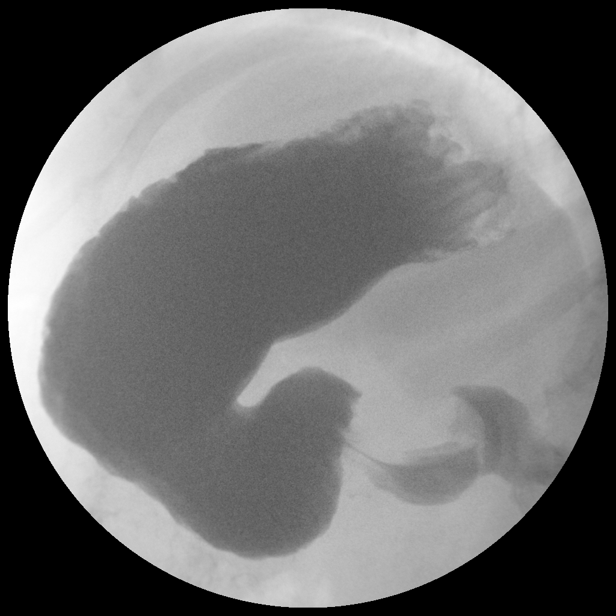

[Series 20: run · 1 of 1 slices shown (12 of 14)]
[im 1/1]
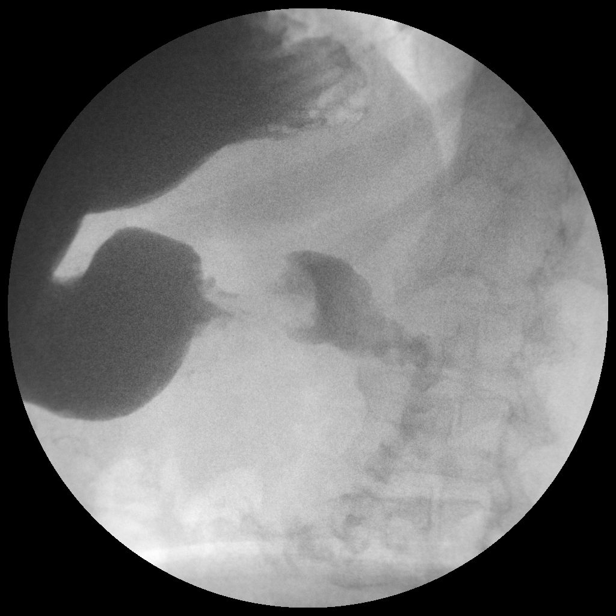

[Series 22: run · 1 of 1 slices shown (13 of 14)]
[im 1/1]
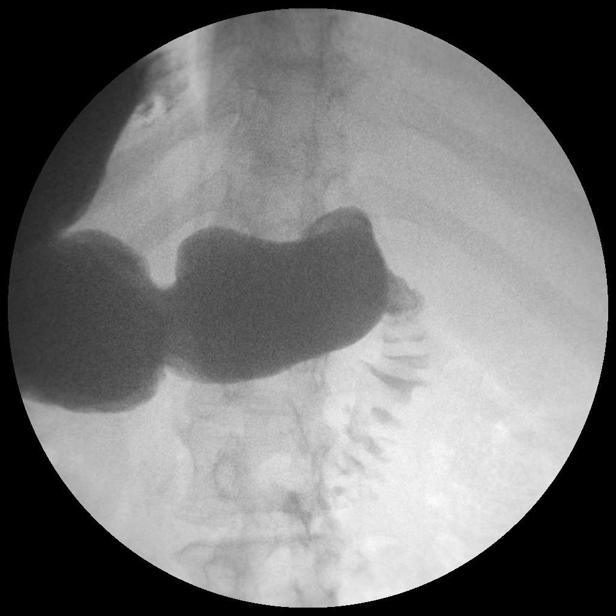

[Series 24: run · 1 of 1 slices shown (14 of 14)]
[im 1/1]
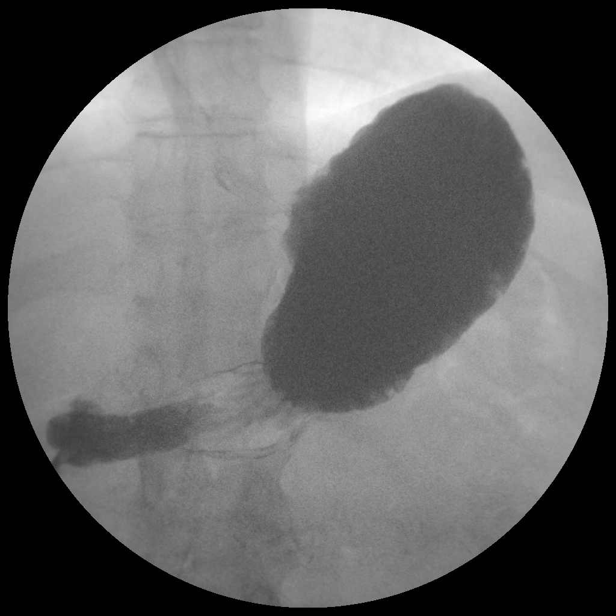

[14 of 24 positions shown; findings below may reference images not displayed]

FINDINGS: Scout film shows moderate stool burden throughout the colon.
Nonobstructive bowel gas pattern. No free air organomegaly.

Fluoroscopic evaluation of swallowing demonstrates normal esophageal
motility. No fixed stricture, fold thickening or mass. Small hiatal
hernia. No reflux with the water siphon maneuver.

Stomach, duodenal bulb and duodenal sweep are unremarkable. No fold
thickening, mass or ulceration.
IMPRESSION: Small hiatal hernia.  Otherwise unremarkable study.

## 2017-01-03 IMAGING — US US ABDOMEN COMPLETE
1 series · 13 of 25 positions shown · non-contrast
Comparison: None.

CLINICAL DATA: Pre bariatric surgery workup

EXAM:
ULTRASOUND ABDOMEN COMPLETE

[Series 1: us abdomen complete · 13 of 70 slices shown]
[im 1/70]
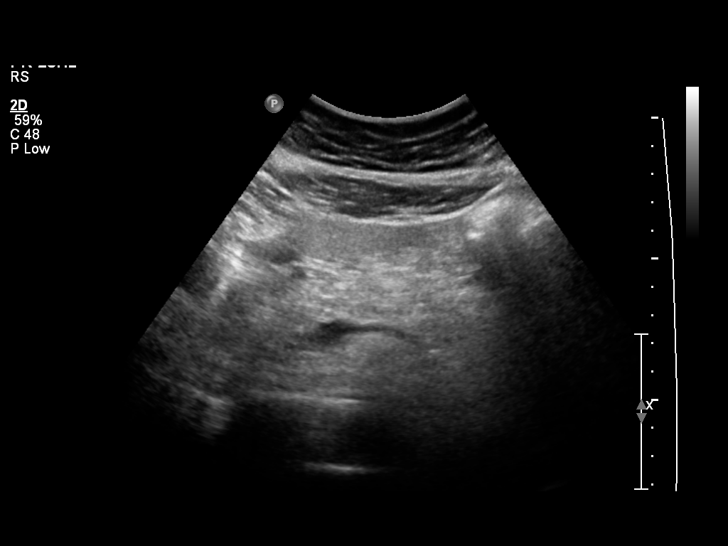
[im 6/70]
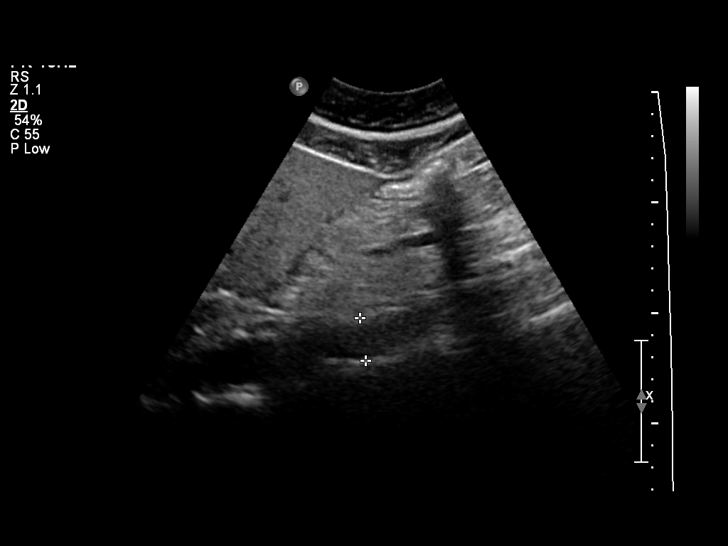
[im 12/70]
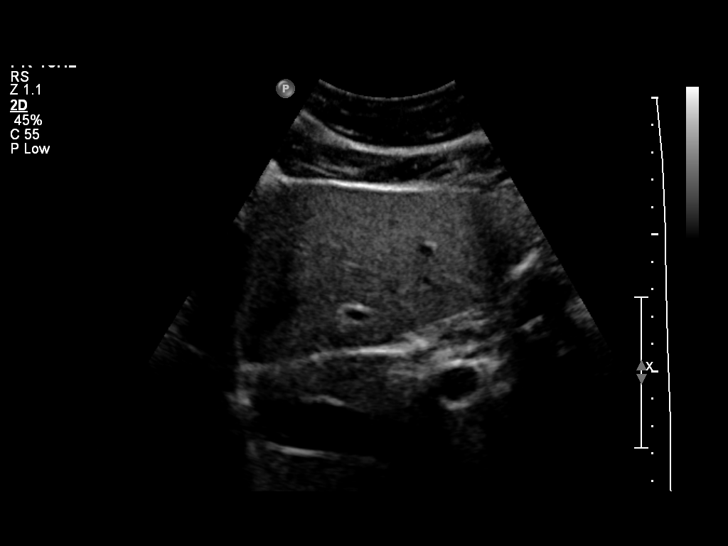
[im 18/70]
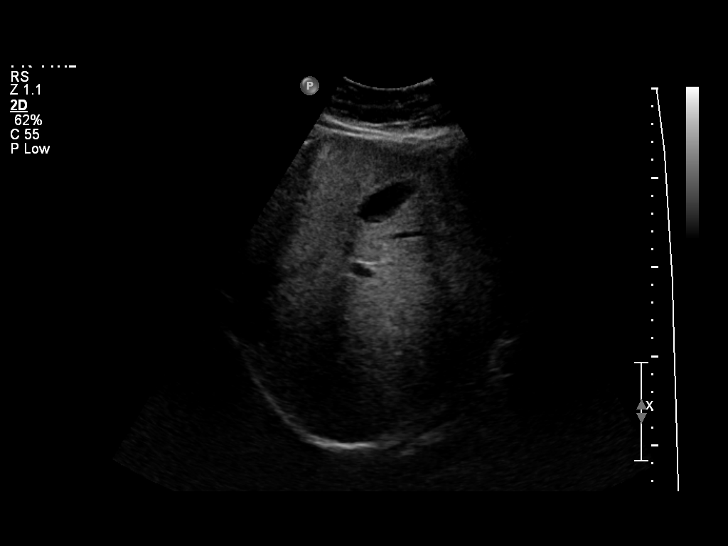
[im 24/70]
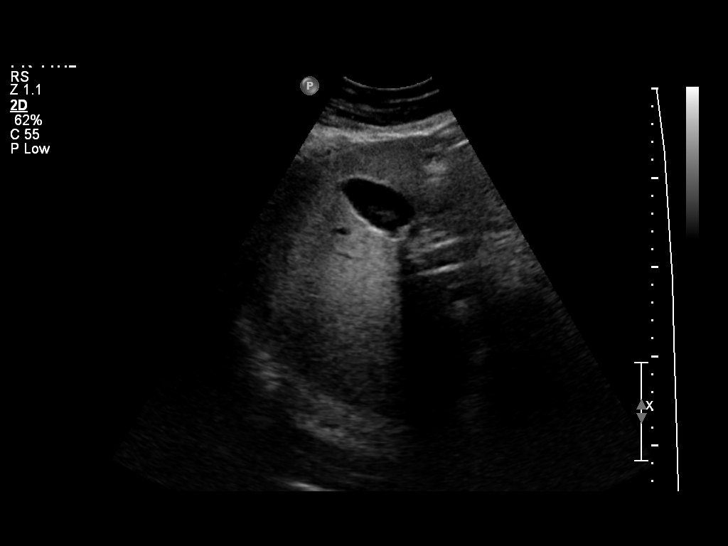
[im 29/70]
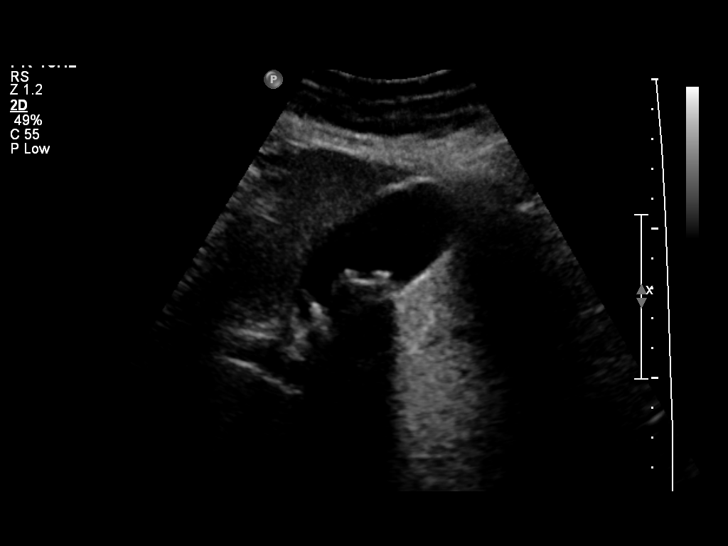
[im 35/70]
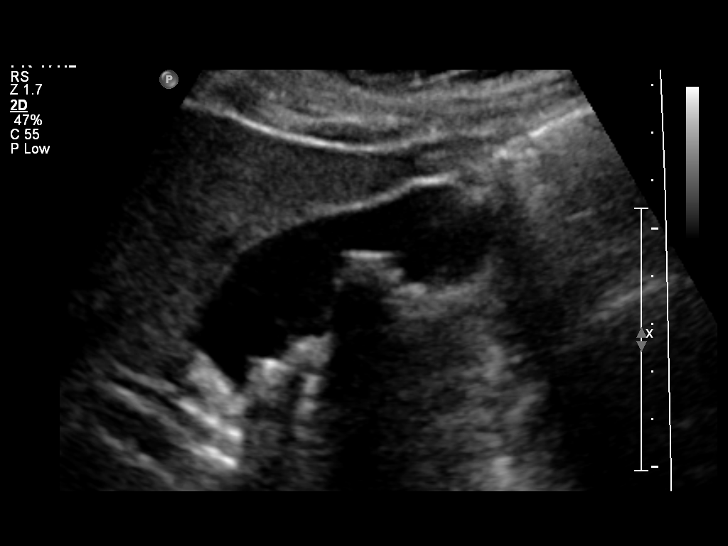
[im 41/70]
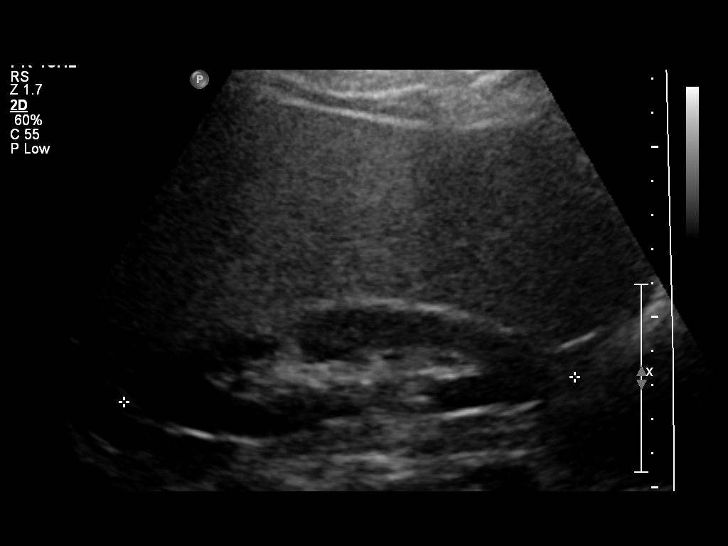
[im 47/70]
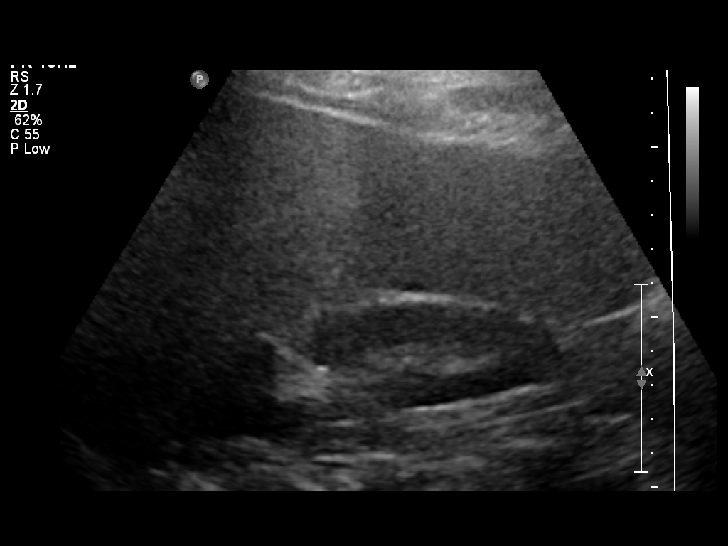
[im 52/70]
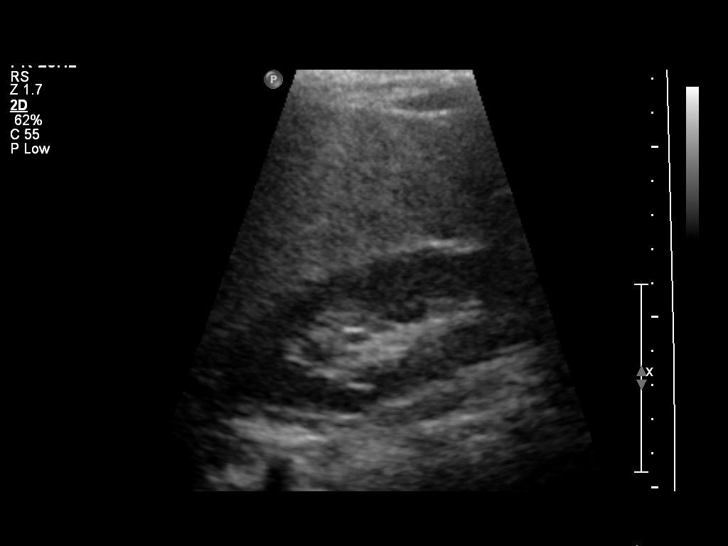
[im 58/70]
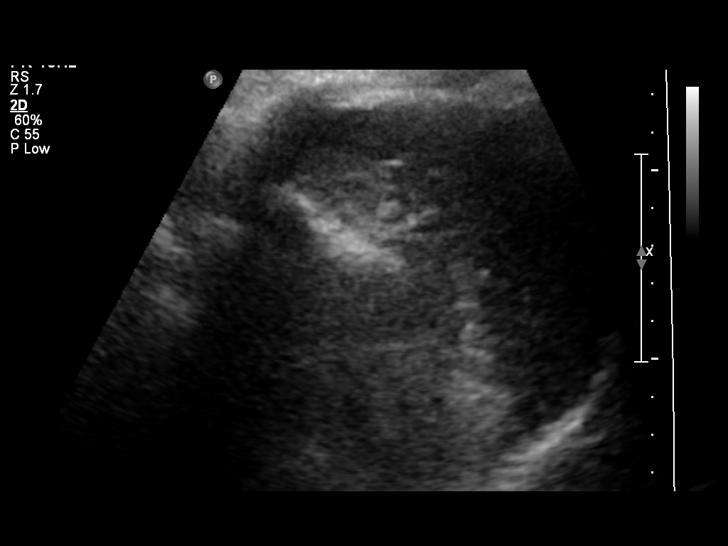
[im 64/70]
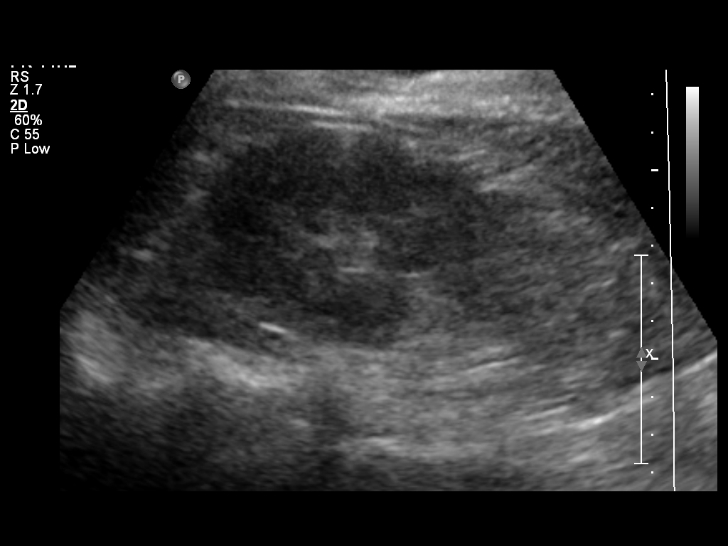
[im 70/70]
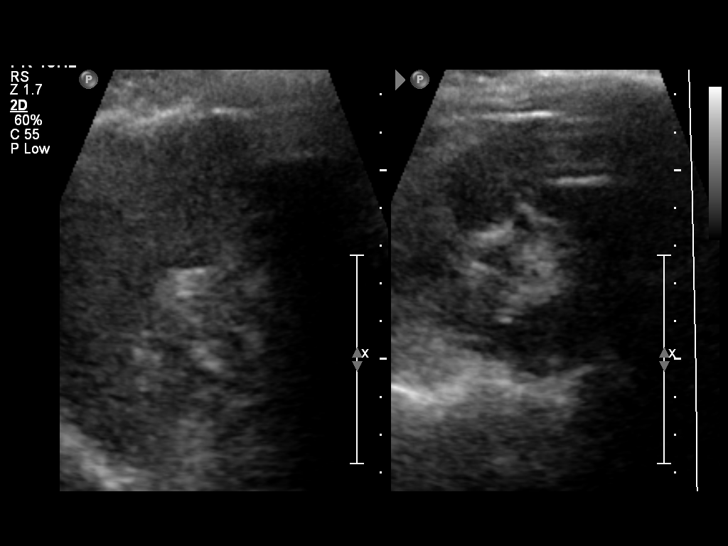

[13 of 25 positions shown; findings below may reference images not displayed]

FINDINGS: Gallbladder: The gallbladder is adequately distended. There are
multiple mobile echogenic gallstones. There is no gallbladder wall
thickening, pericholecystic fluid, or positive sonographic Murphy's
sign.

Common bile duct: Diameter: 3 mm

Liver: The hepatic echotexture is mildly increased diffusely. There
is no focal mass or ductal dilation.

IVC: No abnormality visualized.

Pancreas: There is limited visibility of the pancreatic head and
tail. The pancreatic body exhibits mildly increased echotexture next
annual

Spleen: Size and appearance within normal limits.

Right Kidney: Length: 13.1 cm. Echogenicity within normal limits. No
mass or hydronephrosis visualized.

Left Kidney: Length: 12.8 cm. Echogenicity within normal limits. No
mass or hydronephrosis visualized.

Abdominal aorta: No aneurysm visualized.

Other findings: There is no ascites.
IMPRESSION: 1. Gallstones without evidence of acute cholecystitis.
2. Fatty infiltrative change of the liver. Limited visualization of
the pancreatic head and tail.
3. The remainder of the visualized abdominal viscera are
unremarkable.

## 2017-02-06 ENCOUNTER — Other Ambulatory Visit: Payer: Self-pay | Admitting: Surgery

## 2017-02-06 DIAGNOSIS — Z9884 Bariatric surgery status: Secondary | ICD-10-CM | POA: Diagnosis not present

## 2017-02-06 DIAGNOSIS — K8020 Calculus of gallbladder without cholecystitis without obstruction: Secondary | ICD-10-CM | POA: Diagnosis not present

## 2017-02-06 DIAGNOSIS — R1013 Epigastric pain: Secondary | ICD-10-CM | POA: Diagnosis not present

## 2017-02-18 NOTE — Patient Instructions (Addendum)
Eber JonesCarolyn Wigglesworth  02/18/2017   Your procedure is scheduled on: 02/22/17  Report [facility]  Entrance [place]  elevators to 3rd floor to  Short Stay Center at    0745 AM.  Call this number if you have problems the morning of surgery [phone]   Remember: ONLY 1 PERSON MAY GO WITH YOU TO SHORT STAY TO GET  READY MORNING OF YOUR SURGERY.  Do not eat food or drink liquids :After Midnight.     Take these medicines the morning of surgery with A SIP OF WATER: eye drops as usual, nasonex, synthroid, albuterol if needed and bring, cymbalta,                                You may not have any metal on your body including hair pins and              piercings  Do not wear jewelry, make-up, lotions, powders or perfumes, deodorant             Do not wear nail polish.  Do not shave  48 hours prior to surgery.                 Do not bring valuables to the hospital. [facility] IS NOT             RESPONSIBLE   FOR VALUABLES.  Contacts, dentures or bridgework may not be worn into surgery.  Leave suitcase in the car. After surgery it may be brought to your room.                 Please read over the following fact sheets you were given: _____________________________________________________________________             [facility] - Preparing for [facility] Before surgery, you can play an important role.  Because skin is not sterile, your skin needs to be as free of germs as possible.  You can reduce the number of germs on your skin by washing with CHG (chlorahexidine gluconate) soap before surgery.  CHG is an antiseptic cleaner which kills germs and bonds with the skin to continue killing germs even after washing. Please DO NOT use if you have an allergy to CHG or antibacterial soaps.  If your skin becomes reddened/irritated stop using the CHG and inform your nurse when you arrive at Short Stay. Do not shave (including legs and underarms) for at least 48 hours prior to  the first CHG shower.  You may shave your face/neck. Please follow these instructions carefully:  1.  Shower with CHG Soap the night before surgery and the  morning of Surgery.  2.  If you choose to wash your hair, wash your hair first as usual with your  normal  shampoo.  3.  After you shampoo, rinse your hair and body thoroughly to remove the  shampoo.                           4.  Use CHG as you would any other liquid soap.  You can apply chg directly  to the skin and wash                       Gently with a scrungie or clean washcloth.  5.  Apply the CHG Soap to your body ONLY FROM THE NECK DOWN.   Do not use on face/ open                           Wound or open sores. Avoid contact with eyes, ears mouth and genitals (private parts).                       Wash face,  Genitals (private parts) with your normal soap.             6.  Wash thoroughly, paying special attention to the area where your surgery  will be performed.  7.  Thoroughly rinse your body with warm water from the neck down.  8.  DO NOT shower/wash with your normal soap after using and rinsing off  the CHG Soap.                9.  Pat yourself dry with a clean towel.            10.  Wear clean pajamas.            11.  Place clean sheets on your bed the night of your first shower and do not  sleep with pets. Day of Surgery : Do not apply any lotions/deodorants the morning of surgery.  Please wear clean clothes to the hospital/surgery center.  FAILURE TO FOLLOW THESE INSTRUCTIONS MAY RESULT IN THE CANCELLATION OF YOUR SURGERY PATIENT SIGNATURE_________________________________  NURSE SIGNATURE__________________________________  ________________________________________________________________________

## 2017-02-21 ENCOUNTER — Encounter (HOSPITAL_COMMUNITY)
Admission: RE | Admit: 2017-02-21 | Discharge: 2017-02-21 | Disposition: A | Discharge status: Home / Self Care | Payer: BLUE CROSS/BLUE SHIELD | Source: Ambulatory Visit | Attending: Surgery | Admitting: Surgery

## 2017-02-21 ENCOUNTER — Encounter (HOSPITAL_COMMUNITY): Payer: Self-pay

## 2017-02-21 DIAGNOSIS — K660 Peritoneal adhesions (postprocedural) (postinfection): Secondary | ICD-10-CM | POA: Diagnosis not present

## 2017-02-21 DIAGNOSIS — Z01812 Encounter for preprocedural laboratory examination: Secondary | ICD-10-CM | POA: Insufficient documentation

## 2017-02-21 DIAGNOSIS — K458 Other specified abdominal hernia without obstruction or gangrene: Secondary | ICD-10-CM | POA: Diagnosis not present

## 2017-02-21 DIAGNOSIS — Z9884 Bariatric surgery status: Secondary | ICD-10-CM | POA: Diagnosis not present

## 2017-02-21 DIAGNOSIS — Z87891 Personal history of nicotine dependence: Secondary | ICD-10-CM | POA: Diagnosis not present

## 2017-02-21 DIAGNOSIS — R1013 Epigastric pain: Secondary | ICD-10-CM | POA: Diagnosis present

## 2017-02-21 DIAGNOSIS — K5900 Constipation, unspecified: Secondary | ICD-10-CM | POA: Diagnosis not present

## 2017-02-21 DIAGNOSIS — K289 Gastrojejunal ulcer, unspecified as acute or chronic, without hemorrhage or perforation: Secondary | ICD-10-CM | POA: Diagnosis not present

## 2017-02-21 DIAGNOSIS — Z79899 Other long term (current) drug therapy: Secondary | ICD-10-CM | POA: Diagnosis not present

## 2017-02-21 DIAGNOSIS — E039 Hypothyroidism, unspecified: Secondary | ICD-10-CM | POA: Diagnosis not present

## 2017-02-21 DIAGNOSIS — Z800 Family history of malignant neoplasm of digestive organs: Secondary | ICD-10-CM | POA: Diagnosis not present

## 2017-02-21 HISTORY — DX: Pneumonia, unspecified organism: J18.9

## 2017-02-21 HISTORY — DX: Myoneural disorder, unspecified: G70.9

## 2017-02-21 HISTORY — DX: Personal history of other diseases of the digestive system: Z87.19

## 2017-02-21 LAB — COMPREHENSIVE METABOLIC PANEL
ALBUMIN: 4.6 g/dL (ref 3.5–5.0)
ALT: 34 U/L (ref 14–54)
ANION GAP: 7 (ref 5–15)
AST: 33 U/L (ref 15–41)
Alkaline Phosphatase: 44 U/L (ref 38–126)
BUN: 22 mg/dL — ABNORMAL HIGH (ref 6–20)
CHLORIDE: 102 mmol/L (ref 101–111)
CO2: 31 mmol/L (ref 22–32)
Calcium: 9.7 mg/dL (ref 8.9–10.3)
Creatinine, Ser: 0.72 mg/dL (ref 0.44–1.00)
GFR calc non Af Amer: >60 mL/min (ref >60)
GLUCOSE: 89 mg/dL (ref 65–99)
Potassium: 4.8 mmol/L (ref 3.5–5.1)
Sodium: 140 mmol/L (ref 135–145)
Total Bilirubin: 0.3 mg/dL (ref 0.3–1.2)
Total Protein: 7.3 g/dL (ref 6.5–8.1)

## 2017-02-21 LAB — CBC WITH DIFFERENTIAL/PLATELET
BASOS PCT: 1 %
Basophils Absolute: 0 10*3/uL (ref 0.0–0.1)
EOS ABS: 0.2 10*3/uL (ref 0.0–0.7)
EOS PCT: 2 %
HCT: 38.1 % (ref 36.0–46.0)
HEMOGLOBIN: 12.5 g/dL (ref 12.0–15.0)
LYMPHS ABS: 1.7 10*3/uL (ref 0.7–4.0)
Lymphocytes Relative: 27 %
MCH: 30 pg (ref 26.0–34.0)
MCHC: 32.8 g/dL (ref 30.0–36.0)
MCV: 91.6 fL (ref 78.0–100.0)
Monocytes Absolute: 0.5 10*3/uL (ref 0.1–1.0)
Monocytes Relative: 8 %
NEUTROS PCT: 62 %
Neutro Abs: 3.8 10*3/uL (ref 1.7–7.7)
PLATELETS: 195 10*3/uL (ref 150–400)
RBC: 4.16 MIL/uL (ref 3.87–5.11)
RDW: 14.5 % (ref 11.5–15.5)
WBC: 6.2 10*3/uL (ref 4.0–10.5)

## 2017-02-21 LAB — ABO/RH: ABO/RH(D): O NEG

## 2017-02-21 NOTE — H&P (Signed)
Janet "Janet [name]" [name]  Location: [facility] Patient #: [id number] DOB: 10/05/1956 Married / Language: English / Race: White Female  History of Present Illness   The patient is a 61 year old female who presents for a bariatric surgery evaluation.  She goes by "Janet [name]".  Her PCP is Dr. Salvadore Dom [facility])  She comes with her husband.  I had not seen Janet [name] since last May. So she has missed some appts. Her bigger issue is that on 21 December 2016 she developed pain in her stomach, which is severe. She had cold sweats. She vomited when she got to the emergency room. She had a CT scan in [place] on 12/21/2016 (which I can see through the backdoor of Epic) which raised the question of a obstruction. I don't see an obvious internal hernia or swirling of the mesentery. They raised the question of a common bile duct stone. Her symptoms got better and she went home. She has had 2 attacks since then which were similar - 01/20/2017 and 01/28/2017. Becasue they did not do anything to her in the ER the first time, she rode these out at home. She does not smoke. She is not on NSAIDs. She is also having some trouble with constipation. She is taking Miralax QOD - but she will still go 3 or 4 days without a BM. It sounds like she is getting in at least 80 ounces of fluid a day. She is happy with her weight loss. I gave her the CPT code we use for ordering items for patients to help her with her meds.  I reminded her that we want to her about GI issues in our post op patients. I'm not sure why she did not call.  Plan: I discussed thinks she is best served with laparoscopic exploration. I discussed the indications and potential complications of surgery. Potential palpitations include: Open surgery, bowel resection, or failing to find the cause of her symptoms. I would also do an upper endoscopy at the same time.  History of  obesity (05/2015): She went to the information session online. Her husband's cousin had a sleeve gastrectomy. Her husband works with somebody had a gastric bypass. The patient is interested in a gastric bypass.  She has tried multiple diets including Weight Watchers, Nutrisystem, Slim fast, [employer] at home. She had the most success with the [facility] Center in [place], but that has closed down (the owner died). She had the most success with the Diet Center - she lost 90 pounds, but gained it all back.  Past Medical History: 1. Sleep apnea - but cannot tolerate CPAP 2. Back pain, arthiritis, and issues. she has had 4 injections She sees Dr. Venetia Maxon 3. Family history of colon cancer - she gets a colonoscopy every 5 years 4. Tubal ligation 5. Psych - Dr. Cyndia Skeeters - 05/10/2015 6. I also presented her the C. Diff study, but she is not participatin.  Social history: Married.  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 Her mother died of ALS. She has 1 son age 73. Note: She does not like to drive in [place].   Allergies Christianne Dolin, [place]; 02/06/2017 11:32 AM) Codeine and Related   Medication History Christianne Dolin, RMA; 02/06/2017 11:34 AM) Polyethylene Glycol 3350 (Oral) Active. Flintstones Complete (60MG  Tablet Chewable, Oral daily) Active. Medications Reconciled  Vitals Christianne Dolin RMA; 02/06/2017 11:35 AM) 02/06/2017 11:35 AM Weight: 136.8 lb Height: 61in Body Surface Area: 1.61  m Body Mass Index: 25.85 kg/m  Temp.: 97.62F  Pulse: 65 (Regular)  BP: 114/68 (Sitting, Left Arm, Standard)   Physical Exam  General: WN obese WF alert and generally healthy appearing. HEENT: Normal. Pupils equal.  Neck: Supple. No mass. No thyroid mass.   Heart: RRR Lungs: Clear and symmetric  Abdomen: Soft. No mass. Incisions okay. I feel no mass. She has no tenderness today. She did say that when she  had these attacks, her upper abdomen would swell.  Extremities: Good strength and ROM in upper and lower extremities.  Neurologic: Grossly intact to motor and sensory function. Psychiatric: Has normal mood and affect. Behavior is normal.  Assessment & Plan  1.  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 Jamez Ambrocio  Plan:  1) Plan laparoscopic exploration and upper endoscopy  2.  ASYMPTOMATIC CHOLELITHIASIS (K80.20)  Story: She had cholecystectomy at the same time as the RYGB - 01/23/2016  3.  ABDOMINAL PAIN, EPIGASTRIC (R10.13)  4. Sleep apnea - but cannot tolerate CPAP 5. Back pain, arthiritis, and issues. she has had 4 injections She sees Dr. Rockie NeighboursStern   Lotus Santillo, MD, [facility] Pager: [phone] Office phone:  [phone]

## 2017-02-22 ENCOUNTER — Ambulatory Visit (HOSPITAL_COMMUNITY): Payer: BLUE CROSS/BLUE SHIELD | Admitting: Anesthesiology

## 2017-02-22 ENCOUNTER — Encounter (HOSPITAL_COMMUNITY)
Admission: RE | Disposition: A | Discharge status: Home / Self Care | Payer: Self-pay | Source: Ambulatory Visit | Attending: Surgery

## 2017-02-22 ENCOUNTER — Encounter (HOSPITAL_COMMUNITY): Payer: Self-pay | Admitting: *Deleted

## 2017-02-22 ENCOUNTER — Observation Stay (HOSPITAL_COMMUNITY)
Admission: RE | Admit: 2017-02-22 | Discharge: 2017-02-23 | Disposition: A | Discharge status: Home / Self Care | Payer: BLUE CROSS/BLUE SHIELD | Source: Ambulatory Visit | Attending: Surgery | Admitting: Surgery

## 2017-02-22 DIAGNOSIS — K289 Gastrojejunal ulcer, unspecified as acute or chronic, without hemorrhage or perforation: Principal | ICD-10-CM | POA: Insufficient documentation

## 2017-02-22 DIAGNOSIS — E039 Hypothyroidism, unspecified: Secondary | ICD-10-CM | POA: Insufficient documentation

## 2017-02-22 DIAGNOSIS — Z87891 Personal history of nicotine dependence: Secondary | ICD-10-CM | POA: Insufficient documentation

## 2017-02-22 DIAGNOSIS — K660 Peritoneal adhesions (postprocedural) (postinfection): Secondary | ICD-10-CM | POA: Insufficient documentation

## 2017-02-22 DIAGNOSIS — Z800 Family history of malignant neoplasm of digestive organs: Secondary | ICD-10-CM | POA: Insufficient documentation

## 2017-02-22 DIAGNOSIS — K5900 Constipation, unspecified: Secondary | ICD-10-CM | POA: Insufficient documentation

## 2017-02-22 DIAGNOSIS — Z9884 Bariatric surgery status: Secondary | ICD-10-CM | POA: Insufficient documentation

## 2017-02-22 DIAGNOSIS — Z79899 Other long term (current) drug therapy: Secondary | ICD-10-CM | POA: Insufficient documentation

## 2017-02-22 DIAGNOSIS — K458 Other specified abdominal hernia without obstruction or gangrene: Secondary | ICD-10-CM | POA: Insufficient documentation

## 2017-02-22 DIAGNOSIS — R1013 Epigastric pain: Secondary | ICD-10-CM | POA: Diagnosis not present

## 2017-02-22 DIAGNOSIS — K565 Intestinal adhesions [bands], unspecified as to partial versus complete obstruction: Secondary | ICD-10-CM | POA: Diagnosis not present

## 2017-02-22 HISTORY — PX: LAPAROSCOPY: SHX197

## 2017-02-22 HISTORY — PX: LAPAROSCOPIC LYSIS OF ADHESIONS: SHX5905

## 2017-02-22 HISTORY — PX: ESOPHAGOGASTRODUODENOSCOPY ENDOSCOPY: SHX5814

## 2017-02-22 LAB — TYPE AND SCREEN
ABO/RH(D): O NEG
Antibody Screen: NEGATIVE

## 2017-02-22 SURGERY — LAPAROSCOPY, DIAGNOSTIC
Anesthesia: General

## 2017-02-22 MED ORDER — ACETAMINOPHEN 650 MG RE SUPP: 650.0000 mg | Freq: Four times a day (QID) | RECTAL | Status: DC | PRN

## 2017-02-22 MED ORDER — PROPOFOL 10 MG/ML IV BOLUS
INTRAVENOUS | Status: DC | PRN
  Administered 2017-02-22: 200 mg via INTRAVENOUS

## 2017-02-22 MED ORDER — LACTATED RINGERS IV SOLN
INTRAVENOUS | Status: DC
  Administered 2017-02-22 (×2): via INTRAVENOUS

## 2017-02-22 MED ORDER — PHENYLEPHRINE HCL 10 MG/ML IJ SOLN
INTRAMUSCULAR | Status: DC | PRN
  Administered 2017-02-22: 80 ug via INTRAVENOUS

## 2017-02-22 MED ORDER — LIDOCAINE 2% (20 MG/ML) 5 ML SYRINGE
INTRAMUSCULAR | Status: AC
  Filled 2017-02-22: qty 5

## 2017-02-22 MED ORDER — ROCURONIUM BROMIDE 50 MG/5ML IV SOSY
PREFILLED_SYRINGE | INTRAVENOUS | Status: AC
  Filled 2017-02-22: qty 5

## 2017-02-22 MED ORDER — KETOROLAC TROMETHAMINE 30 MG/ML IJ SOLN: 30.0000 mg | Freq: Once | INTRAMUSCULAR | Status: DC | PRN

## 2017-02-22 MED ORDER — ONDANSETRON 4 MG PO TBDP: 4.0000 mg | ORAL_TABLET | Freq: Four times a day (QID) | ORAL | Status: DC | PRN

## 2017-02-22 MED ORDER — SUCCINYLCHOLINE CHLORIDE 200 MG/10ML IV SOSY
PREFILLED_SYRINGE | INTRAVENOUS | Status: DC | PRN
  Administered 2017-02-22: 120 mg via INTRAVENOUS

## 2017-02-22 MED ORDER — FENTANYL CITRATE (PF) 250 MCG/5ML IJ SOLN
INTRAMUSCULAR | Status: AC
  Filled 2017-02-22: qty 5

## 2017-02-22 MED ORDER — MIDAZOLAM HCL 2 MG/2ML IJ SOLN
INTRAMUSCULAR | Status: AC
  Filled 2017-02-22: qty 2

## 2017-02-22 MED ORDER — CHLORHEXIDINE GLUCONATE CLOTH 2 % EX PADS: 6.0000 | MEDICATED_PAD | Freq: Once | CUTANEOUS | Status: DC

## 2017-02-22 MED ORDER — FENTANYL CITRATE (PF) 100 MCG/2ML IJ SOLN
INTRAMUSCULAR | Status: DC | PRN
  Administered 2017-02-22: 50 ug via INTRAVENOUS
  Administered 2017-02-22: 100 ug via INTRAVENOUS
  Administered 2017-02-22: 50 ug via INTRAVENOUS

## 2017-02-22 MED ORDER — LEVOTHYROXINE SODIUM 50 MCG PO TABS
50.0000 ug | ORAL_TABLET | ORAL | Status: DC
  Administered 2017-02-23: 50 ug via ORAL
  Filled 2017-02-22: qty 1

## 2017-02-22 MED ORDER — ACETAMINOPHEN 325 MG PO TABS
650.0000 mg | ORAL_TABLET | Freq: Four times a day (QID) | ORAL | Status: DC | PRN
  Filled 2017-02-22: qty 2

## 2017-02-22 MED ORDER — ROCURONIUM BROMIDE 10 MG/ML (PF) SYRINGE
PREFILLED_SYRINGE | INTRAVENOUS | Status: DC | PRN
Start: 2017-02-22 — End: 2017-02-22
  Administered 2017-02-22: 40 mg via INTRAVENOUS
  Administered 2017-02-22: 10 mg via INTRAVENOUS

## 2017-02-22 MED ORDER — TRAZODONE HCL 100 MG PO TABS
100.0000 mg | ORAL_TABLET | Freq: Every day | ORAL | Status: DC
  Administered 2017-02-22: 100 mg via ORAL
  Filled 2017-02-22: qty 1

## 2017-02-22 MED ORDER — SUCCINYLCHOLINE CHLORIDE 200 MG/10ML IV SOSY
PREFILLED_SYRINGE | INTRAVENOUS | Status: AC
  Filled 2017-02-22: qty 10

## 2017-02-22 MED ORDER — CEFOTETAN DISODIUM-DEXTROSE 2-2.08 GM-% IV SOLR
2.0000 g | INTRAVENOUS | Status: AC
  Administered 2017-02-22: 2 g via INTRAVENOUS
  Filled 2017-02-22: qty 50

## 2017-02-22 MED ORDER — BUPIVACAINE HCL (PF) 0.25 % IJ SOLN
INTRAMUSCULAR | Status: DC | PRN
  Administered 2017-02-22: 20 mL

## 2017-02-22 MED ORDER — LACTATED RINGERS IR SOLN: Status: DC | PRN

## 2017-02-22 MED ORDER — ONDANSETRON HCL 4 MG/2ML IJ SOLN: 4.0000 mg | Freq: Four times a day (QID) | INTRAMUSCULAR | Status: DC | PRN

## 2017-02-22 MED ORDER — SUGAMMADEX SODIUM 200 MG/2ML IV SOLN
INTRAVENOUS | Status: DC | PRN
  Administered 2017-02-22: 150 mg via INTRAVENOUS

## 2017-02-22 MED ORDER — DULOXETINE HCL 60 MG PO CPEP
60.0000 mg | ORAL_CAPSULE | Freq: Every day | ORAL | Status: DC
  Administered 2017-02-23: 60 mg via ORAL
  Filled 2017-02-22: qty 1

## 2017-02-22 MED ORDER — BUPIVACAINE HCL (PF) 0.25 % IJ SOLN
INTRAMUSCULAR | Status: AC
  Filled 2017-02-22: qty 30

## 2017-02-22 MED ORDER — CEFOTETAN DISODIUM-DEXTROSE 2-2.08 GM-% IV SOLR
INTRAVENOUS | Status: AC
  Filled 2017-02-22: qty 50

## 2017-02-22 MED ORDER — SUCRALFATE 1 GM/10ML PO SUSP
1.0000 g | Freq: Three times a day (TID) | ORAL | Status: DC
  Administered 2017-02-22 – 2017-02-23 (×2): 1 g via ORAL
  Filled 2017-02-22 (×2): qty 10

## 2017-02-22 MED ORDER — ONDANSETRON HCL 4 MG/2ML IJ SOLN
INTRAMUSCULAR | Status: AC
  Filled 2017-02-22: qty 2

## 2017-02-22 MED ORDER — PROMETHAZINE HCL 25 MG/ML IJ SOLN: 6.2500 mg | INTRAMUSCULAR | Status: DC | PRN

## 2017-02-22 MED ORDER — PANTOPRAZOLE SODIUM 40 MG PO TBEC
40.0000 mg | DELAYED_RELEASE_TABLET | Freq: Two times a day (BID) | ORAL | Status: DC
  Administered 2017-02-22 – 2017-02-23 (×2): 40 mg via ORAL
  Filled 2017-02-22 (×2): qty 1

## 2017-02-22 MED ORDER — HYDROMORPHONE HCL 1 MG/ML IJ SOLN: 0.2500 mg | INTRAMUSCULAR | Status: DC | PRN

## 2017-02-22 MED ORDER — ONDANSETRON HCL 4 MG/2ML IJ SOLN
INTRAMUSCULAR | Status: DC | PRN
  Administered 2017-02-22: 4 mg via INTRAVENOUS

## 2017-02-22 MED ORDER — LIDOCAINE 2% (20 MG/ML) 5 ML SYRINGE
INTRAMUSCULAR | Status: DC | PRN
  Administered 2017-02-22: 100 mg via INTRAVENOUS

## 2017-02-22 MED ORDER — POTASSIUM CHLORIDE IN NACL 20-0.45 MEQ/L-% IV SOLN
INTRAVENOUS | Status: DC
  Administered 2017-02-22: 19:00:00 via INTRAVENOUS
  Administered 2017-02-23: 1000 mL via INTRAVENOUS
  Filled 2017-02-22 (×3): qty 1000

## 2017-02-22 MED ORDER — TAPENTADOL HCL 50 MG PO TABS
50.0000 mg | ORAL_TABLET | Freq: Every day | ORAL | Status: DC | PRN
  Administered 2017-02-22: 50 mg via ORAL
  Filled 2017-02-22 (×3): qty 1

## 2017-02-22 MED ORDER — MIDAZOLAM HCL 5 MG/5ML IJ SOLN
INTRAMUSCULAR | Status: DC | PRN
  Administered 2017-02-22: 2 mg via INTRAVENOUS

## 2017-02-22 MED ORDER — HEPARIN SODIUM (PORCINE) 5000 UNIT/ML IJ SOLN
5000.0000 [IU] | Freq: Three times a day (TID) | INTRAMUSCULAR | Status: DC
  Administered 2017-02-23: 5000 [IU] via SUBCUTANEOUS
  Filled 2017-02-22: qty 1

## 2017-02-22 MED ORDER — MORPHINE SULFATE (PF) 4 MG/ML IV SOLN
1.0000 mg | INTRAVENOUS | Status: DC | PRN
  Administered 2017-02-22: 2 mg via INTRAVENOUS
  Filled 2017-02-22: qty 1

## 2017-02-22 MED ORDER — DEXAMETHASONE SODIUM PHOSPHATE 10 MG/ML IJ SOLN
INTRAMUSCULAR | Status: DC | PRN
  Administered 2017-02-22: 10 mg via INTRAVENOUS

## 2017-02-22 MED ORDER — PROPOFOL 10 MG/ML IV BOLUS
INTRAVENOUS | Status: AC
  Filled 2017-02-22: qty 20

## 2017-02-22 MED ORDER — ALBUTEROL SULFATE (2.5 MG/3ML) 0.083% IN NEBU: 2.5000 mg | INHALATION_SOLUTION | Freq: Four times a day (QID) | RESPIRATORY_TRACT | Status: DC | PRN

## 2017-02-22 SURGICAL SUPPLY — 26 items
BENZOIN TINCTURE PRP APPL 2/3 (GAUZE/BANDAGES/DRESSINGS) IMPLANT
CABLE HIGH FREQUENCY MONO STRZ (ELECTRODE) IMPLANT
CHLORAPREP W/TINT 26ML (MISCELLANEOUS) ×3 IMPLANT
COVER SURGICAL LIGHT HANDLE (MISCELLANEOUS) ×3 IMPLANT
DECANTER SPIKE VIAL GLASS SM (MISCELLANEOUS) IMPLANT
DERMABOND ADVANCED (GAUZE/BANDAGES/DRESSINGS)
DERMABOND ADVANCED .7 DNX12 (GAUZE/BANDAGES/DRESSINGS) IMPLANT
ELECT REM PT RETURN 9FT ADLT (ELECTROSURGICAL) ×3
ELECTRODE REM PT RTRN 9FT ADLT (ELECTROSURGICAL) ×2 IMPLANT
GLOVE SURG SIGNA 7.5 PF LTX (GLOVE) ×3 IMPLANT
GOWN STRL REUS W/TWL XL LVL3 (GOWN DISPOSABLE) ×9 IMPLANT
IRRIG SUCT STRYKERFLOW 2 WTIP (MISCELLANEOUS)
IRRIGATION SUCT STRKRFLW 2 WTP (MISCELLANEOUS) IMPLANT
KIT BASIN OR (CUSTOM PROCEDURE TRAY) ×3 IMPLANT
SHEARS HARMONIC ACE PLUS 36CM (ENDOMECHANICALS) IMPLANT
SLEEVE XCEL OPT CAN 5 100 (ENDOMECHANICALS) ×3 IMPLANT
STRIP CLOSURE SKIN 1/2X4 (GAUZE/BANDAGES/DRESSINGS) IMPLANT
SUT MNCRL AB 4-0 PS2 18 (SUTURE) ×6 IMPLANT
SUT SILK 2 0 SH (SUTURE) ×3 IMPLANT
SUT VICRYL 0 UR6 27IN ABS (SUTURE) ×3 IMPLANT
TOWEL OR 17X26 10 PK STRL BLUE (TOWEL DISPOSABLE) ×3 IMPLANT
TOWEL OR NON WOVEN STRL DISP B (DISPOSABLE) ×3 IMPLANT
TRAY LAPAROSCOPIC (CUSTOM PROCEDURE TRAY) ×3 IMPLANT
TROCAR BLADELESS OPT 5 100 (ENDOMECHANICALS) ×3 IMPLANT
TROCAR XCEL BLUNT TIP 100MML (ENDOMECHANICALS) IMPLANT
TROCAR XCEL NON-BLD 11X100MML (ENDOMECHANICALS) IMPLANT

## 2017-02-22 NOTE — Progress Notes (Signed)
SURGICAL PORTION CLEAN

## 2017-02-22 NOTE — Transfer of Care (Signed)
Immediate Anesthesia Transfer of Care Note  Patient: Janet [name]  Procedure(s) Performed: Procedure(s): LAPAROSCOPY EXPLORATION/ CLOSURE OF PETERSON DEFECT (N/A) ESOPHAGOGASTRODUODENOSCOPY ENDOSCOPY (N/A) LAPAROSCOPIC LYSIS OF ADHESIONS  Patient Location: PACU  Anesthesia Type:General  Level of Consciousness: sedated  Airway & Oxygen Therapy: Patient Spontanous Breathing and Patient connected to face mask oxygen  Post-op Assessment: Report given to RN and Post -op Vital signs reviewed and stable  Post vital signs: Reviewed and stable  Last Vitals:  Vitals:   02/22/17 0737  BP: 120/60  Pulse: 64  Resp: 18  Temp: 36.7 C    Last Pain:  Vitals:   02/22/17 0737  TempSrc: Oral         Complications: No apparent anesthesia complications

## 2017-02-22 NOTE — Interval H&P Note (Signed)
History and Physical Interval Note:  02/22/2017 11:05 AM  Janet [name]  has presented today for surgery, with the diagnosis of Bowel obstruction  The various methods of treatment have been discussed with the patient and family.  Her husband is with her.  She has had no more attacks since I saw her.  After consideration of risks, benefits and other options for treatment, the patient has consented to  Procedure(s): LAPAROSCOPY EXPLORATION (N/A) ESOPHAGOGASTRODUODENOSCOPY ENDOSCOPY (N/A) LAPAROSCOPIC SMALL BOWEL RESECTION (N/A) as a surgical intervention .  The patient's history has been reviewed, patient examined, no change in status, stable for surgery.  I have reviewed the patient's chart and labs.  Questions were answered to the patient's satisfaction.     Danny Yackley H

## 2017-02-22 NOTE — Anesthesia Postprocedure Evaluation (Signed)
Anesthesia Post Note  Patient: Janet [name]  Procedure(s) Performed: Procedure(s) (LRB): LAPAROSCOPY EXPLORATION/ CLOSURE OF PETERSON DEFECT (N/A) ESOPHAGOGASTRODUODENOSCOPY ENDOSCOPY (N/A) LAPAROSCOPIC LYSIS OF ADHESIONS  Patient location during evaluation: PACU Anesthesia Type: General Level of consciousness: awake and alert Pain management: pain level controlled Vital Signs Assessment: post-procedure vital signs reviewed and stable Respiratory status: spontaneous breathing, nonlabored ventilation, respiratory function stable and patient connected to nasal cannula oxygen Cardiovascular status: blood pressure returned to baseline and stable Postop Assessment: no signs of nausea or vomiting Anesthetic complications: no       Last Vitals:  Vitals:   02/22/17 1345 02/22/17 1350  BP: (!) 142/73   Pulse: 70 67  Resp: 14 10  Temp:      Last Pain:  Vitals:   02/22/17 1330  TempSrc:   PainSc: 0-No pain                 Weylyn Ricciuti S

## 2017-02-22 NOTE — Op Note (Addendum)
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  ASSISTANT:   Shelbie Proctor, PA Student  ANESTHESIA:   general  Anesthesiologist: Eilene Ghazi, MD CRNA: Orest Dikes, CRNA; Doran Clay, CRNA  General  EBL:  minimal  ml  BLOOD ADMINISTERED: none  DRAINS: none   LOCAL MEDICATIONS USED:   20 cc 1/4% marcaine  SPECIMEN:   none  COUNTS CORRECT:  YES  INDICATIONS FOR PROCEDURE:  Janet [name] is a 61 y.o. (DOB: September 16, 1956) white female whose primary care physician is COLLINS, DANA, DO and comes for laparoscopic exploration and upper endoscopy.   She has had several periods of acute abdominal pain over the last 3 months.    She had a RYGB 01/23/2016 by Dr. Ezzard Standing.    The indications and risks of the surgery were explained to the patient.  The risks include, but are not limited to, infection, bleeding, and nerve injury.  PROCEDURE:    The patient was taken to room #4 at [facility].  She was given and general anesthesia, her abdomen was prepped with ChloraPrep, and she was given 2 g of cefotetan at the initiation procedure.   A timeout was held and surgical checklist run.   I accessed her abdominal cavity through an infraumbilical incision with a 5 mm balloon Hassan trocar. I placed 2 additional trochars: A 5 mm trocar in the right mid abdomen and a 5 mm trocar in the left lower quadrant.   I carried out an abdominal exploration. The right and left lobes of her liver were unremarkable. Her gallbladder was absent. Her  bowel that I could see was all fairly remarkable unremarkable.   I then identified the gastric pouch and followed the jejunum down from the pouch over the colon to the jejunojejunal anastomosis. I was able to follow the biliary limb back towards the ligament of Treitz. I then followed the small bowel beyond the jejunojejunal anastomosis to the terminal ileum. There were at least 2 areas that she appeared to have lymphatic congestion, but she had no obvious internal hernia at the time of this exploration. I ran the length the small bowel at least twice and found no other abnormalities small bowel.   She did have 4 adhesive bands that I divided sharply. One band was attached to the ligament of Treitz. Some omentum was attached to the anterior peritoneal surface. In the she had a couple of stringy adhesions around her jejunojejunostomy, but there was no entrapped bowel. I took down into the adhesed areas with either Bovie electrocautery or Harmonic scalpel.   She did have a defect between the colon and the jejunal limb to the stomach consistent with the Peterson's defect. There was no bowel in this defect. I closed this defect with a single 2-0 silk suture.   So I really never found a good reason for her abdominal pain, though there were some areas of the small bowel that he suggested some mild lymphatic congestion.   I then broke scrub and didn't upper endoscopy. I used a standard upper endoscope and passed  this into the gastric pouch. Her gastroesophageal junction was at about 36 cm. Her gastrojejunal anastomosis was at about 40 cm. She had a small posterior marginal ulcer at her gastrojejunostomy. I passed the scope about 20 cm down beyond the anastomosis and saw no evidence of obstruction or mass. I did not do any biopsies. I then withdrew the scope without difficulty   I then re-scrubbed and closed the umbilical incision with a 0 Vicryl suture. The trochars were all removed and the skin closed with a  4-0 Monocryl suture. The skin was painted with liquid band. I used about 20 mL of Marcaine in the incisions.   The patient tolerated the procedure well and transferred to recovery room in good condition.   Photo of screen - the ulcer is at the 7 o'clock position just beyond the anastomosis.  Ovidio Kinavid Nalin Mazzocco, MD, [facility] Pager: [phone] Office phone:  [phone]

## 2017-02-22 NOTE — Anesthesia Preprocedure Evaluation (Signed)
Anesthesia Evaluation  Patient identified by MRN, date of birth, ID band Patient awake    Reviewed: Allergy & Precautions, NPO status , Patient's Chart, lab work & pertinent test results  Airway Mallampati: II  TM Distance: >3 FB Neck ROM: Full    Dental no notable dental hx.    Pulmonary asthma , sleep apnea , former smoker,    Pulmonary exam normal breath sounds clear to auscultation       Cardiovascular negative cardio ROS Normal cardiovascular exam Rhythm:Regular Rate:Normal     Neuro/Psych negative neurological ROS  negative psych ROS   GI/Hepatic negative GI ROS, Neg liver ROS,   Endo/Other  Hypothyroidism   Renal/GU negative Renal ROS  negative genitourinary   Musculoskeletal negative musculoskeletal ROS (+)   Abdominal   Peds negative pediatric ROS (+)  Hematology negative hematology ROS (+)   Anesthesia Other Findings   Reproductive/Obstetrics negative OB ROS                             Anesthesia Physical Anesthesia Plan  ASA: II  Anesthesia Plan: General   Post-op Pain Management:    Induction: Intravenous  Airway Management Planned: Oral ETT  Additional Equipment:   Intra-op Plan:   Post-operative Plan: Extubation in OR  Informed Consent: I have reviewed the patients History and Physical, chart, labs and discussed the procedure including the risks, benefits and alternatives for the proposed anesthesia with the patient or authorized representative who has indicated his/her understanding and acceptance.   Dental advisory given  Plan Discussed with: CRNA and Surgeon  Anesthesia Plan Comments:         Anesthesia Quick Evaluation

## 2017-02-22 NOTE — Anesthesia Procedure Notes (Signed)
Procedure Name: Intubation Date/Time: 02/22/2017 11:49 AM Performed by: Lind Covert Pre-anesthesia Checklist: Patient identified, Emergency Drugs available, Suction available, Patient being monitored and Timeout performed Patient Re-evaluated:Patient Re-evaluated prior to inductionOxygen Delivery Method: Circle system utilized Preoxygenation: Pre-oxygenation with 100% oxygen Intubation Type: IV induction Laryngoscope Size: Mac and 3 Tube type: Oral Tube size: 7.0 mm Number of attempts: 1 Airway Equipment and Method: Stylet Placement Confirmation: ETT inserted through vocal cords under direct vision,  positive ETCO2 and breath sounds checked- equal and bilateral Secured at: 22 cm Tube secured with: Tape Dental Injury: Teeth and Oropharynx as per pre-operative assessment

## 2017-02-23 DIAGNOSIS — Z79899 Other long term (current) drug therapy: Secondary | ICD-10-CM | POA: Diagnosis not present

## 2017-02-23 DIAGNOSIS — Z9884 Bariatric surgery status: Secondary | ICD-10-CM | POA: Diagnosis not present

## 2017-02-23 DIAGNOSIS — K289 Gastrojejunal ulcer, unspecified as acute or chronic, without hemorrhage or perforation: Secondary | ICD-10-CM | POA: Diagnosis not present

## 2017-02-23 DIAGNOSIS — Z87891 Personal history of nicotine dependence: Secondary | ICD-10-CM | POA: Diagnosis not present

## 2017-02-23 DIAGNOSIS — K5900 Constipation, unspecified: Secondary | ICD-10-CM | POA: Diagnosis not present

## 2017-02-23 DIAGNOSIS — K458 Other specified abdominal hernia without obstruction or gangrene: Secondary | ICD-10-CM | POA: Diagnosis not present

## 2017-02-23 DIAGNOSIS — Z800 Family history of malignant neoplasm of digestive organs: Secondary | ICD-10-CM | POA: Diagnosis not present

## 2017-02-23 DIAGNOSIS — K660 Peritoneal adhesions (postprocedural) (postinfection): Secondary | ICD-10-CM | POA: Diagnosis not present

## 2017-02-23 DIAGNOSIS — E039 Hypothyroidism, unspecified: Secondary | ICD-10-CM | POA: Diagnosis not present

## 2017-02-23 MED ORDER — SUCRALFATE 1 GM/10ML PO SUSP: 1.0000 g | Freq: Three times a day (TID) | ORAL | 0 refills | Status: DC

## 2017-02-23 MED ORDER — PANTOPRAZOLE SODIUM 40 MG PO TBEC: 40.0000 mg | DELAYED_RELEASE_TABLET | Freq: Two times a day (BID) | ORAL | Status: AC

## 2017-02-23 NOTE — Discharge Instructions (Signed)

## 2017-02-23 NOTE — Discharge Summary (Signed)
Physician Discharge Summary  Patient ID: Janet [name] MRN: [id number] DOB/AGE: 59/02/1956 59 y.o.  Admit date: 02/22/2017 Discharge date: 02/23/2017  Admission Diagnoses: abdominal pain  Discharge Diagnoses:  Active Problems:   Marginal ulcer   Discharged Condition: good  Hospital Course: Pt admitted for abdominal pain concerning for internal hernia.  Diagnostic laparoscopy showed no henia but pt did have marginal ulcer on EGD.  She was treated for this and now feels much better.  Consults: None  Significant Diagnostic Studies: labs: cbc, chemistry  Treatments: IV hydration, analgesia:  and surgery: see above  Discharge Exam: Blood pressure 118/68, pulse 60, temperature 98.2 F (36.8 [name]), temperature source Oral, resp. rate 16, height 5\' 1"  (1.549 m), weight 59.9 kg (132 lb), SpO2 99 %. General appearance: alert and cooperative GI: soft, non-distended Incision/Wound: clean, dry, intact  Disposition: 01-Home or Self Care   Allergies as of 02/23/2017      Reactions   Hydrocodone Hives, Itching   Managed with Benadryl      Medication List    TAKE these medications   acetaminophen 500 MG tablet Commonly known as:  TYLENOL Take 1,000 mg by mouth daily as needed for mild pain or moderate pain.   albuterol 108 (90 Base) MCG/ACT inhaler Commonly known as:  PROVENTIL HFA;VENTOLIN HFA Inhale 2 puffs into the lungs every 6 (six) hours as needed for wheezing or shortness of breath.   Biotin 5000 MCG Caps Take 2 capsules by mouth daily.   Calcium Citrate-Vitamin D 500-500 MG-UNIT Chew Chew 1 tablet by mouth 3 (three) times daily.   Cyanocobalamin 5000 MCG Subl Place 1 tablet under the tongue every 7 (seven) days. Saturdays   desloratadine 5 MG tablet Commonly known as:  CLARINEX Take 5 mg by mouth daily.   DULoxetine 60 MG capsule Commonly known as:  CYMBALTA Take 60 mg by mouth daily.   levothyroxine 50 MCG tablet Commonly known as:  SYNTHROID,  LEVOTHROID Take 50 mcg by mouth daily before breakfast.   mometasone 50 MCG/ACT nasal spray Commonly known as:  NASONEX Place 2 sprays into the nose daily.   MUCINEX D 727 520 0260 MG Tb12 Generic drug:  Pseudoephedrine-Guaifenesin Take 1 tablet by mouth 2 (two) times daily as needed (sinus congestion).   pantoprazole 40 MG tablet Commonly known as:  PROTONIX Take 1 tablet (40 mg total) by mouth 2 (two) times daily.   PAZEO 0.7 % Soln Generic drug:  Olopatadine HCl Apply 1 drop to eye daily.   pediatric multivitamin chewable tablet Chew 1 tablet by mouth 2 (two) times daily. Equate [employer] Vitamins   polyethylene glycol packet Commonly known as:  MIRALAX / GLYCOLAX Take 17 g by mouth every other day.   sucralfate 1 GM/10ML suspension Commonly known as:  CARAFATE Take 10 mLs (1 g total) by mouth 4 (four) times daily -  with meals and at bedtime.   tapentadol 50 MG tablet Commonly known as:  NUCYNTA Take 50 mg by mouth daily as needed for moderate pain.   traZODone 100 MG tablet Commonly known as:  DESYREL Take 100 mg by mouth at bedtime.   Vitamin D3 5000 units Caps Take 5,000 Units by mouth daily.      Follow-up Information    NEWMAN,DAVID H, MD. Schedule an appointment as soon as possible for a visit in 2 week(s).   Specialty:  General Surgery Contact information: [address] [place] [address] [phone]  Signed: Elic Vencill [name]. 02/23/2017, 9:18 AM

## 2017-02-25 ENCOUNTER — Encounter (HOSPITAL_COMMUNITY): Payer: Self-pay | Admitting: Surgery

## 2017-04-30 DIAGNOSIS — N898 Other specified noninflammatory disorders of vagina: Secondary | ICD-10-CM | POA: Diagnosis not present

## 2017-04-30 DIAGNOSIS — R5383 Other fatigue: Secondary | ICD-10-CM | POA: Diagnosis not present

## 2017-06-03 NOTE — Addendum Note (Signed)
Addendum  created 06/03/17 1130 by Rishith Siddoway, MD   Sign clinical note    

## 2017-06-03 NOTE — Anesthesia Postprocedure Evaluation (Signed)
Anesthesia Post Note  Patient: Janet [name]  Procedure(s) Performed: Procedure(s) (LRB): LAPAROSCOPY EXPLORATION/ CLOSURE OF PETERSON DEFECT (N/A) ESOPHAGOGASTRODUODENOSCOPY ENDOSCOPY (N/A) LAPAROSCOPIC LYSIS OF ADHESIONS     Anesthesia Post Evaluation  Last Vitals:  Vitals:   02/23/17 0152 02/23/17 0540  BP: (!) 93/48 118/68  Pulse: 61 60  Resp: 15 16  Temp: 36.5 C 36.8 C    Last Pain:  Vitals:   02/23/17 1027  TempSrc:   PainSc: 3                  [name] S

## 2017-06-19 DIAGNOSIS — E7800 Pure hypercholesterolemia, unspecified: Secondary | ICD-10-CM | POA: Diagnosis not present

## 2017-06-21 DIAGNOSIS — Z0000 Encounter for general adult medical examination without abnormal findings: Secondary | ICD-10-CM | POA: Diagnosis not present

## 2017-07-30 DIAGNOSIS — Z1231 Encounter for screening mammogram for malignant neoplasm of breast: Secondary | ICD-10-CM | POA: Diagnosis not present

## 2017-08-28 DIAGNOSIS — J329 Chronic sinusitis, unspecified: Secondary | ICD-10-CM | POA: Diagnosis not present

## 2017-08-28 DIAGNOSIS — R591 Generalized enlarged lymph nodes: Secondary | ICD-10-CM | POA: Diagnosis not present

## 2017-09-05 DIAGNOSIS — R591 Generalized enlarged lymph nodes: Secondary | ICD-10-CM | POA: Diagnosis not present

## 2017-09-05 DIAGNOSIS — J329 Chronic sinusitis, unspecified: Secondary | ICD-10-CM | POA: Diagnosis not present

## 2017-09-09 DIAGNOSIS — M5416 Radiculopathy, lumbar region: Secondary | ICD-10-CM | POA: Diagnosis not present

## 2017-09-09 DIAGNOSIS — M4727 Other spondylosis with radiculopathy, lumbosacral region: Secondary | ICD-10-CM | POA: Diagnosis not present

## 2017-09-10 DIAGNOSIS — J342 Deviated nasal septum: Secondary | ICD-10-CM | POA: Diagnosis not present

## 2017-09-10 DIAGNOSIS — R0981 Nasal congestion: Secondary | ICD-10-CM | POA: Diagnosis not present

## 2017-09-10 DIAGNOSIS — J343 Hypertrophy of nasal turbinates: Secondary | ICD-10-CM | POA: Diagnosis not present

## 2017-09-10 DIAGNOSIS — J3489 Other specified disorders of nose and nasal sinuses: Secondary | ICD-10-CM | POA: Diagnosis not present

## 2017-09-17 DIAGNOSIS — J342 Deviated nasal septum: Secondary | ICD-10-CM | POA: Diagnosis not present

## 2017-09-17 DIAGNOSIS — J329 Chronic sinusitis, unspecified: Secondary | ICD-10-CM | POA: Diagnosis not present

## 2017-09-23 DIAGNOSIS — J3489 Other specified disorders of nose and nasal sinuses: Secondary | ICD-10-CM | POA: Diagnosis not present

## 2017-09-23 DIAGNOSIS — J342 Deviated nasal septum: Secondary | ICD-10-CM | POA: Diagnosis not present

## 2017-09-23 DIAGNOSIS — R0981 Nasal congestion: Secondary | ICD-10-CM | POA: Diagnosis not present

## 2017-09-23 DIAGNOSIS — J343 Hypertrophy of nasal turbinates: Secondary | ICD-10-CM | POA: Diagnosis not present

## 2017-10-01 DIAGNOSIS — E559 Vitamin D deficiency, unspecified: Secondary | ICD-10-CM | POA: Diagnosis not present

## 2017-10-01 DIAGNOSIS — R591 Generalized enlarged lymph nodes: Secondary | ICD-10-CM | POA: Diagnosis not present

## 2017-10-01 DIAGNOSIS — Z79899 Other long term (current) drug therapy: Secondary | ICD-10-CM | POA: Diagnosis not present

## 2017-10-01 DIAGNOSIS — Z23 Encounter for immunization: Secondary | ICD-10-CM | POA: Diagnosis not present

## 2017-10-01 DIAGNOSIS — E039 Hypothyroidism, unspecified: Secondary | ICD-10-CM | POA: Diagnosis not present

## 2017-10-01 DIAGNOSIS — E538 Deficiency of other specified B group vitamins: Secondary | ICD-10-CM | POA: Diagnosis not present

## 2017-10-01 DIAGNOSIS — K219 Gastro-esophageal reflux disease without esophagitis: Secondary | ICD-10-CM | POA: Diagnosis not present

## 2021-11-13 ENCOUNTER — Ambulatory Visit: Payer: BC Managed Care – PPO | Admitting: Podiatry

## 2021-11-16 ENCOUNTER — Ambulatory Visit: Payer: BC Managed Care – PPO | Admitting: Podiatry

## 2021-11-16 ENCOUNTER — Ambulatory Visit (INDEPENDENT_AMBULATORY_CARE_PROVIDER_SITE_OTHER): Payer: BC Managed Care – PPO

## 2021-11-16 ENCOUNTER — Other Ambulatory Visit: Payer: Self-pay

## 2021-11-16 DIAGNOSIS — M722 Plantar fascial fibromatosis: Secondary | ICD-10-CM | POA: Diagnosis not present

## 2021-11-16 DIAGNOSIS — M216X9 Other acquired deformities of unspecified foot: Secondary | ICD-10-CM | POA: Diagnosis not present

## 2021-11-16 DIAGNOSIS — M7732 Calcaneal spur, left foot: Secondary | ICD-10-CM | POA: Diagnosis not present

## 2021-11-16 DIAGNOSIS — M2142 Flat foot [pes planus] (acquired), left foot: Secondary | ICD-10-CM

## 2021-11-16 MED ORDER — BETAMETHASONE SOD PHOS & ACET 6 (3-3) MG/ML IJ SUSP
6.0000 mg | Freq: Once | INTRAMUSCULAR | Status: AC
  Administered 2021-11-16: 09:00:00 6 mg

## 2021-11-16 NOTE — Patient Instructions (Signed)

## 2021-11-16 NOTE — Progress Notes (Signed)
  Subjective:  Patient ID: Janet [name], female    DOB: 09/27/1956,  MRN: [id number]  Chief Complaint  Patient presents with   Foot Pain    I had surgery last year in April and I had it done in [place] and has sharp pains   65 y.o. female presents with the above complaint. History confirmed with patient. States she has pain in the heel, worst in the AM. States she has tried OTC inserts without relief.  Unsure what surgery she had performed and states she had general foot pain does not appear that heel pain was her main issue at that time.  Objective:  Physical Exam: warm, good capillary refill, no trophic changes or ulcerative lesions, normal DP and PT pulses, and normal sensory exam. Left Foot: tenderness to palpation medial calcaneal tuber, no pain with calcaneal squeeze, decreased ankle joint ROM, and +Silverskiold test  Radiographs: X-ray of the left foot: no evidence of calcaneal stress fracture, plantar calcaneal spur, and healed calcaneal osteotomy with two screw fixation in good position  Assessment:   1. Plantar fasciitis of left foot   2. Acquired pes planus, left   3. Equinus deformity of foot   4. Calcaneal spur of left foot    Plan:  Patient was evaluated and treated and all questions answered.  Plantar Fasciitis -XR reviewed with patient -Educated patient on stretching and icing of the affected limb -Night splint dispensed -Injection delivered to the plantar fascia of the left foot.  Procedure: Injection Tendon/Ligament Consent: Verbal consent obtained. Location: Left plantar fascia at the glabrous junction; medial approach. Skin Prep: Alcohol. Injectate: 1 cc 0.5% marcaine plain, 1 cc betamethasone acetate-betamethasone sodium phosphate Disposition: Patient tolerated procedure well. Injection site dressed with a band-aid.  Pes Planus -Based on XR and skin incisions it appears she had a medial calcaneal slide ostoetomy, gastrocnemius recession, PT tendon  debridement or repair -She still has pain in the heels despite OTC inserts. She may be a good candidate for CMOs. Will provide info for patient to check on coverage.  Return in about 4 weeks (around 12/14/2021) for Plantar fasciitis.

## 2021-11-17 ENCOUNTER — Encounter: Payer: Self-pay | Admitting: Podiatry

## 2021-11-17 NOTE — Addendum Note (Signed)
Addended by: Hadley Pen R on: 11/17/2021 08:45 AM   Modules accepted: Orders

## 2021-12-14 ENCOUNTER — Telehealth: Payer: Self-pay | Admitting: Podiatry

## 2021-12-14 ENCOUNTER — Ambulatory Visit (INDEPENDENT_AMBULATORY_CARE_PROVIDER_SITE_OTHER): Payer: BC Managed Care – PPO | Admitting: Podiatry

## 2021-12-14 ENCOUNTER — Encounter: Payer: Self-pay | Admitting: Podiatry

## 2021-12-14 DIAGNOSIS — M722 Plantar fascial fibromatosis: Secondary | ICD-10-CM | POA: Diagnosis not present

## 2021-12-14 NOTE — Telephone Encounter (Signed)
Great we will go ahead and process them

## 2021-12-14 NOTE — Progress Notes (Signed)
°  Subjective:  Patient ID: Janet [name], female    DOB: 03/23/1956,  MRN: [id number]  Chief Complaint  Patient presents with   Plantar Fasciitis    I have good and bad days and the injection did help and it is the left heel   65 y.o. female presents with the above complaint. History confirmed with patient.    Objective:  Physical Exam: warm, good capillary refill, no trophic changes or ulcerative lesions, normal DP and PT pulses, and normal sensory exam. Left Foot: tenderness to palpation medial calcaneal tuber, no pain with calcaneal squeeze, decreased ankle joint ROM, and +Silverskiold test  Assessment:   1. Plantar fasciitis of left foot    Plan:  Patient was evaluated and treated and all questions answered.  Plantar Fasciitis -Repeat injection left heel -F/u should pain persist. -Casted for CMOs - will fabricate.  Procedure: Injection Tendon/Ligament Consent: Verbal consent obtained. Location: Left plantar fascia at the glabrous junction; medial approach. Skin Prep: Alcohol. Injectate: 1 cc 0.5% marcaine plain, 1 cc betamethasone acetate-betamethasone sodium phosphate Disposition: Patient tolerated procedure well. Injection site dressed with a band-aid.  No follow-ups on file.

## 2021-12-14 NOTE — Telephone Encounter (Signed)
Pt left message stating she was seen today by Dr Samuella Cota and was given the information to call her insurance to check benefits for the orthotics and she said she was told they would cover them at 100% and she wants to proceed with ordering them.

## 2022-01-31 ENCOUNTER — Ambulatory Visit: Payer: BC Managed Care – PPO

## 2022-01-31 DIAGNOSIS — M7732 Calcaneal spur, left foot: Secondary | ICD-10-CM

## 2022-01-31 DIAGNOSIS — M2142 Flat foot [pes planus] (acquired), left foot: Secondary | ICD-10-CM

## 2022-01-31 DIAGNOSIS — M722 Plantar fascial fibromatosis: Secondary | ICD-10-CM

## 2022-01-31 NOTE — Progress Notes (Signed)
SITUATION: Reason for Visit: Fitting and Delivery of Custom Fabricated Foot Orthoses Patient Report: Patient reports comfort and is satisfied with device.  OBJECTIVE DATA: Patient History / Diagnosis:     ICD-10-CM   1. Plantar fasciitis of left foot  M72.2     2. Acquired pes planus, left  M21.42     3. Calcaneal spur of left foot  M77.32       Provided Device:  Custom Functional Foot Orthotics     [employer]: [id number]  GOAL OF ORTHOSIS - Improve gait - Decrease energy expenditure - Improve Balance - Provide Triplanar stability of foot complex - Facilitate motion  ACTIONS PERFORMED Patient was fit with foot orthotics trimmed to shoe last. Patient tolerated fittign procedure.   Patient was provided with verbal and written instruction and demonstration regarding donning, doffing, wear, care, proper fit, function, purpose, cleaning, and use of the orthosis and in all related precautions and risks and benefits regarding the orthosis.  Patient was also provided with verbal instruction regarding how to report any failures or malfunctions of the orthosis and necessary follow up care. Patient was also instructed to contact our office regarding any change in status that may affect the function of the orthosis.  Patient demonstrated independence with proper donning, doffing, and fit and verbalized understanding of all instructions.  PLAN: Patient is to follow up in one week or as necessary (PRN). All questions were answered and concerns addressed. Plan of care was discussed with and agreed upon by the patient.

## 2024-02-13 ENCOUNTER — Other Ambulatory Visit (HOSPITAL_BASED_OUTPATIENT_CLINIC_OR_DEPARTMENT_OTHER): Payer: Self-pay | Admitting: Family Medicine

## 2024-02-13 ENCOUNTER — Ambulatory Visit (INDEPENDENT_AMBULATORY_CARE_PROVIDER_SITE_OTHER)
Admission: RE | Admit: 2024-02-13 | Discharge: 2024-02-13 | Disposition: A | Discharge status: Home / Self Care | Payer: BC Managed Care – PPO | Source: Ambulatory Visit | Attending: Family Medicine | Admitting: Family Medicine

## 2024-02-13 DIAGNOSIS — Z01818 Encounter for other preprocedural examination: Secondary | ICD-10-CM | POA: Diagnosis not present

## 2025-02-16 ENCOUNTER — Encounter: Admitting: Dietician
